# Patient Record
Sex: Female | Born: 1980 | Race: White | Hispanic: No | Marital: Single | Smoking: Former smoker
Health system: Southern US, Community
[De-identification: ages and names within clinical notes are randomized; demographics above are authoritative.]

## PROBLEM LIST (undated history)

## (undated) DIAGNOSIS — Z5189 Encounter for other specified aftercare: Secondary | ICD-10-CM

## (undated) DIAGNOSIS — R87619 Unspecified abnormal cytological findings in specimens from cervix uteri: Secondary | ICD-10-CM

## (undated) DIAGNOSIS — IMO0001 Reserved for inherently not codable concepts without codable children: Secondary | ICD-10-CM

## (undated) DIAGNOSIS — L03119 Cellulitis of unspecified part of limb: Secondary | ICD-10-CM

## (undated) DIAGNOSIS — A64 Unspecified sexually transmitted disease: Secondary | ICD-10-CM

## (undated) DIAGNOSIS — L02619 Cutaneous abscess of unspecified foot: Secondary | ICD-10-CM

## (undated) DIAGNOSIS — B351 Tinea unguium: Secondary | ICD-10-CM

## (undated) HISTORY — DX: Unspecified sexually transmitted disease: A64

## (undated) HISTORY — DX: Cellulitis of unspecified part of limb: L03.119

## (undated) HISTORY — DX: Tinea unguium: B35.1

## (undated) HISTORY — DX: Cutaneous abscess of unspecified foot: L02.619

## (undated) HISTORY — DX: Unspecified abnormal cytological findings in specimens from cervix uteri: R87.619

## (undated) HISTORY — PX: COLPOSCOPY: SHX161

## (undated) HISTORY — DX: Encounter for other specified aftercare: Z51.89

## (undated) HISTORY — DX: Reserved for inherently not codable concepts without codable children: IMO0001

---
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
## 1998-11-20 ENCOUNTER — Other Ambulatory Visit: Admission: RE | Admit: 1998-11-20 | Discharge: 1998-11-20 | Payer: Self-pay | Admitting: *Deleted

## 2000-01-11 ENCOUNTER — Other Ambulatory Visit: Admission: RE | Admit: 2000-01-11 | Discharge: 2000-01-11 | Payer: Self-pay | Admitting: *Deleted

## 2001-04-03 ENCOUNTER — Other Ambulatory Visit: Admission: RE | Admit: 2001-04-03 | Discharge: 2001-04-03 | Payer: Self-pay | Admitting: Obstetrics and Gynecology

## 2002-05-20 ENCOUNTER — Other Ambulatory Visit: Admission: RE | Admit: 2002-05-20 | Discharge: 2002-05-20 | Payer: Self-pay | Admitting: Obstetrics and Gynecology

## 2003-01-30 ENCOUNTER — Emergency Department (HOSPITAL_COMMUNITY): Admission: EM | Admit: 2003-01-30 | Discharge: 2003-01-30 | Payer: Self-pay | Admitting: Emergency Medicine

## 2003-08-08 ENCOUNTER — Other Ambulatory Visit: Admission: RE | Admit: 2003-08-08 | Discharge: 2003-08-08 | Payer: Self-pay | Admitting: Obstetrics and Gynecology

## 2004-09-29 ENCOUNTER — Other Ambulatory Visit: Admission: RE | Admit: 2004-09-29 | Discharge: 2004-09-29 | Payer: Self-pay | Admitting: Obstetrics and Gynecology

## 2005-06-20 ENCOUNTER — Inpatient Hospital Stay (HOSPITAL_COMMUNITY): Admission: AD | Admit: 2005-06-20 | Discharge: 2005-06-22 | Payer: Self-pay | Admitting: Obstetrics and Gynecology

## 2006-03-13 ENCOUNTER — Emergency Department (HOSPITAL_COMMUNITY): Admission: EM | Admit: 2006-03-13 | Discharge: 2006-03-13 | Payer: Self-pay | Admitting: Emergency Medicine

## 2006-09-26 ENCOUNTER — Emergency Department (HOSPITAL_COMMUNITY): Admission: EM | Admit: 2006-09-26 | Discharge: 2006-09-26 | Payer: Self-pay | Admitting: Emergency Medicine

## 2007-02-01 ENCOUNTER — Ambulatory Visit: Payer: Self-pay | Admitting: Internal Medicine

## 2007-02-01 DIAGNOSIS — J0100 Acute maxillary sinusitis, unspecified: Secondary | ICD-10-CM

## 2007-02-01 DIAGNOSIS — F988 Other specified behavioral and emotional disorders with onset usually occurring in childhood and adolescence: Secondary | ICD-10-CM

## 2007-02-01 DIAGNOSIS — B351 Tinea unguium: Secondary | ICD-10-CM | POA: Insufficient documentation

## 2007-02-01 HISTORY — DX: Tinea unguium: B35.1

## 2007-03-02 ENCOUNTER — Ambulatory Visit: Payer: Self-pay | Admitting: Internal Medicine

## 2007-04-23 ENCOUNTER — Telehealth (INDEPENDENT_AMBULATORY_CARE_PROVIDER_SITE_OTHER): Payer: Self-pay | Admitting: *Deleted

## 2007-06-11 ENCOUNTER — Other Ambulatory Visit: Admission: RE | Admit: 2007-06-11 | Discharge: 2007-06-11 | Payer: Self-pay | Admitting: Obstetrics and Gynecology

## 2007-07-11 ENCOUNTER — Ambulatory Visit: Payer: Self-pay | Admitting: Internal Medicine

## 2007-07-11 DIAGNOSIS — T887XXA Unspecified adverse effect of drug or medicament, initial encounter: Secondary | ICD-10-CM | POA: Insufficient documentation

## 2007-07-11 LAB — CONVERTED CEMR LAB
ALT: 32 units/L (ref 0–35)
AST: 30 units/L (ref 0–37)
Albumin: 4 g/dL (ref 3.5–5.2)
Alkaline Phosphatase: 51 units/L (ref 39–117)
Bilirubin, Direct: 0.1 mg/dL (ref 0.0–0.3)
Total Protein: 6.9 g/dL (ref 6.0–8.3)

## 2007-07-12 ENCOUNTER — Telehealth: Payer: Self-pay | Admitting: Internal Medicine

## 2007-09-19 ENCOUNTER — Telehealth: Payer: Self-pay | Admitting: Internal Medicine

## 2007-09-19 ENCOUNTER — Ambulatory Visit: Payer: Self-pay | Admitting: Internal Medicine

## 2007-11-27 ENCOUNTER — Ambulatory Visit: Payer: Self-pay | Admitting: Internal Medicine

## 2008-01-04 HISTORY — PX: FOOT SURGERY: SHX648

## 2008-03-03 ENCOUNTER — Ambulatory Visit: Payer: Self-pay | Admitting: Internal Medicine

## 2008-03-03 DIAGNOSIS — J309 Allergic rhinitis, unspecified: Secondary | ICD-10-CM

## 2008-03-03 DIAGNOSIS — F17200 Nicotine dependence, unspecified, uncomplicated: Secondary | ICD-10-CM | POA: Insufficient documentation

## 2008-06-05 ENCOUNTER — Ambulatory Visit: Payer: Self-pay | Admitting: Internal Medicine

## 2008-09-02 ENCOUNTER — Ambulatory Visit: Payer: Self-pay | Admitting: Internal Medicine

## 2008-09-02 DIAGNOSIS — N390 Urinary tract infection, site not specified: Secondary | ICD-10-CM | POA: Insufficient documentation

## 2008-09-16 ENCOUNTER — Telehealth: Payer: Self-pay | Admitting: Internal Medicine

## 2008-09-16 ENCOUNTER — Ambulatory Visit: Payer: Self-pay | Admitting: Internal Medicine

## 2008-09-16 DIAGNOSIS — L03119 Cellulitis of unspecified part of limb: Secondary | ICD-10-CM

## 2008-09-16 DIAGNOSIS — L02619 Cutaneous abscess of unspecified foot: Secondary | ICD-10-CM | POA: Insufficient documentation

## 2008-09-16 HISTORY — DX: Cutaneous abscess of unspecified foot: L02.619

## 2008-09-17 ENCOUNTER — Encounter: Payer: Self-pay | Admitting: Internal Medicine

## 2008-11-05 ENCOUNTER — Ambulatory Visit: Payer: Self-pay | Admitting: Internal Medicine

## 2008-11-20 ENCOUNTER — Telehealth: Payer: Self-pay | Admitting: Internal Medicine

## 2009-01-14 ENCOUNTER — Telehealth: Payer: Self-pay | Admitting: Internal Medicine

## 2009-01-19 ENCOUNTER — Telehealth: Payer: Self-pay | Admitting: Internal Medicine

## 2009-02-06 ENCOUNTER — Ambulatory Visit: Payer: Self-pay | Admitting: Internal Medicine

## 2009-06-03 DIAGNOSIS — A64 Unspecified sexually transmitted disease: Secondary | ICD-10-CM

## 2009-06-03 HISTORY — DX: Unspecified sexually transmitted disease: A64

## 2009-07-03 ENCOUNTER — Telehealth: Payer: Self-pay | Admitting: Internal Medicine

## 2009-07-28 ENCOUNTER — Telehealth: Payer: Self-pay | Admitting: Internal Medicine

## 2009-08-06 ENCOUNTER — Ambulatory Visit: Payer: Self-pay | Admitting: Internal Medicine

## 2009-09-16 ENCOUNTER — Telehealth: Payer: Self-pay | Admitting: Internal Medicine

## 2009-11-02 ENCOUNTER — Ambulatory Visit: Payer: Self-pay | Admitting: Internal Medicine

## 2009-11-09 ENCOUNTER — Telehealth: Payer: Self-pay | Admitting: Internal Medicine

## 2009-11-10 ENCOUNTER — Ambulatory Visit: Payer: Self-pay | Admitting: Internal Medicine

## 2009-11-13 ENCOUNTER — Encounter: Payer: Self-pay | Admitting: Internal Medicine

## 2009-12-23 ENCOUNTER — Telehealth (INDEPENDENT_AMBULATORY_CARE_PROVIDER_SITE_OTHER): Payer: Self-pay | Admitting: *Deleted

## 2010-01-13 ENCOUNTER — Ambulatory Visit
Admission: RE | Admit: 2010-01-13 | Discharge: 2010-01-13 | Payer: Self-pay | Source: Home / Self Care | Attending: Internal Medicine | Admitting: Internal Medicine

## 2010-01-13 ENCOUNTER — Encounter: Payer: Self-pay | Admitting: Internal Medicine

## 2010-02-04 NOTE — Medication Information (Signed)
Summary: Adderall Xr Approved  Adderall Xr Approved   Imported By: Maryln Gottron 01/21/2010 10:39:38  _____________________________________________________________________  External Attachment:    Type:   Image     Comment:   External Document

## 2010-02-04 NOTE — Progress Notes (Signed)
Summary: pa needed  Phone Note Call from Patient Call back at Home Phone [phone]   Caller: Patient---live call Summary of Call: need pa on adderall to [facility]---ph--[phone]     fax---[phone]       [facility]. Pt stated that she changed ins co. Initial call taken by: Warnell Forester,  December 23, 2009 1:58 PM  Follow-up for Phone Call        Pt called to check on status of getting adderall. Pls call asap. [phone] Follow-up by: Lucy Antigua,  December 24, 2009 11:42 AM  Additional Follow-up for Phone Call Additional follow up Details #1::        IN PROCESS Additional Follow-up by: Warnell Forester,  December 24, 2009 3:47 PM    Additional Follow-up for Phone Call Additional follow up Details #2::    lmoam to return my call Follow-up by: Warnell Forester,  December 30, 2009 3:58 PM

## 2010-02-04 NOTE — Progress Notes (Signed)
Summary: ear clogged  Phone Note Call from Patient Call back at Home Phone [phone]   Caller: vm Summary of Call: Since Thurs, ear clogged, interfering with sound.  Request ov. Initial call taken by: Rudy Jew, RN,  January 19, 2009 12:42 PM  Follow-up for Phone Call        talked with pt- will try allegra with psudeophedrine that she has Follow-up by: Willy Eddy, LPN,  January 19, 2009 1:53 PM

## 2010-02-04 NOTE — Assessment & Plan Note (Signed)
Summary: 2 MONTH ROV/cjr   Vital Signs:  Patient profile:   30 year old female Height:      63.5 inches Weight:      112 pounds BMI:     19.60 Temp:     98.2 degrees F oral Pulse rate:   76 / minute Resp:     14 per minute BP sitting:   110 / 70  (left arm)  Vitals Entered By: Willy Eddy, LPN (November 02, 2009 8:46 AM) CC: roa- med review Is Patient Diabetic? No   Primary Care Provider:  Stacie Glaze MD  CC:  roa- med review.  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 One By Mouth On Work Days 2)  Adderall 5 Mg  Tabs (Amphetamine-Dextroamphetamine) .... One By Mouth After Lunch 3)  Lohist-12d 6-45 Mg Xr12h-Tab (Brompheniramine-Pseudoeph) .... One By Mouth Bid 4)  Vyvanse 50 Mg Caps (Lisdexamfetamine Dimesylate) .... One By Mouth in Am  Allergies (verified): 1)  ! Bactrim (Sulfamethoxazole-Trimethoprim)  Past History:  Family History: Last updated: 02/01/2007 Family History High cholesterol Family History Hypertension Family History of Prostate CA 1st  degree relative <50  Social History: Last updated: 02/01/2007 Married Current Smoker Drug use-no Regular exercise-yes  Risk Factors: Exercise: yes (02/01/2007)  Risk Factors: Smoking Status: current (11/02/2009) Packs/Day: 0.5 (11/02/2009) Passive Smoke Exposure: no (11/02/2009)  Past medical, surgical, family and social histories (including risk factors) reviewed, and no changes noted (except as noted below).  Past Medical History: Reviewed history from 02/01/2007 and no changes required. blood tranfusion  due to placental abruption  Family History: Reviewed history from 02/01/2007 and no changes required. Family History High cholesterol Family History Hypertension Family History of Prostate CA 1st degree relative <50  Social History: Reviewed history from 02/01/2007 and no changes required. Married Current Smoker Drug use-no Regular exercise-yes  Review of Systems  The patient denies anorexia, fever, weight loss, weight gain, vision loss, decreased hearing, hoarseness, chest pain, syncope, dyspnea on exertion, peripheral edema, prolonged cough, headaches, hemoptysis, abdominal pain, melena, hematochezia, severe indigestion/heartburn, hematuria, incontinence, genital sores, muscle weakness, suspicious skin lesions, transient blindness, difficulty walking, depression, unusual weight change, abnormal bleeding, enlarged lymph nodes, angioedema, and breast masses.         Flu Vaccine Consent Questions     Do you have a history of severe allergic reactions to this vaccine? no    Any prior history of allergic reactions to egg and/or gelatin? no    Do you have a sensitivity to the preservative Thimersol?  no    Do you have a past history of Guillan-Barre Syndrome? no    Do you currently have an acute febrile illness? no    Have you ever had a severe reaction to latex? no    Vaccine information given and explained to patient? yes    Are you currently pregnant? no    Lot  Number:[id number]   Exp Date:07/03/2010   Site Given  Left Deltoid IM   Physical Exam  General:  Well-developed,well-nourished,in no acute distress; alert,appropriate and cooperative throughout examination Head:  Normocephalic and atraumatic without obvious abnormalities. No apparent alopecia or balding. Eyes:  pupils equal and pupils round.   Ears:  R ear normal and L ear normal.   Nose:  no external deformity and no nasal discharge.   Mouth:  pharyngeal erythema and postnasal drip.   Neck:  supple and full ROM.   Lungs:  Normal respiratory effort, chest expands symmetrically. Lungs are clear to auscultation, no crackles or wheezes. Heart:  Normal rate and regular rhythm. S1 and S2 normal without gallop, murmur, click, rub or other extra sounds. Abdomen:  Bowel sounds positive,abdomen soft and non-tender without masses, organomegaly or hernias noted. Extremities:  No clubbing, cyanosis, edema, or deformity noted with normal full range of motion of all joints.   Neurologic:  No cranial nerve deficits noted. Station and gait are normal. Plantar reflexes are down-going bilaterally. DTRs are symmetrical throughout. Sensory, motor and coordinative functions appear intact.   Impression & Recommendations:  Problem # 1:  ATTENTION DEFICIT DISORDER, ADULT (ICD-314.00)  Complete Medication List: 1)  Lohist-12d 6-45 Mg Xr12h-tab (Brompheniramine-pseudoeph) .... One by mouth bid 2)  Vyvanse 50 Mg Caps (Lisdexamfetamine dimesylate) .... One by mouth in am with food  Other Orders: Admin 1st Vaccine (16109) Flu Vaccine 31yrs + (60454)  Patient Instructions: 1)  Please schedule a follow-up appointment in 3 months. Prescriptions: VYVANSE 50 MG CAPS (LISDEXAMFETAMINE DIMESYLATE) one by mouth in AM with food  #30 x 0   Entered and Authorized by:   Stacie Glaze MD   Signed by:   Stacie Glaze MD on 11/02/2009   Method used:   Print then Give to Patient   RxID:   [id number] VYVANSE 50 MG  CAPS (LISDEXAMFETAMINE DIMESYLATE) one by mouth in AM with food  #30 x 0   Entered and Authorized by:   Stacie Glaze MD   Signed by:   Stacie Glaze MD on 11/02/2009   Method used:   Print then Give to Patient   RxID:   [id number] VYVANSE 50 MG CAPS (LISDEXAMFETAMINE DIMESYLATE) one by mouth in AM with food  #30 x 0   Entered and Authorized by:   Stacie Glaze MD   Signed by:   Stacie Glaze MD on 11/02/2009   Method used:   Print then Give to Patient   RxID:   [id number]    Orders Added: 1)  Admin 1st Vaccine [90471] 2)  Flu Vaccine 60yrs + [44034] 3)  Est. Patient Level III [74259]

## 2010-02-04 NOTE — Medication Information (Signed)
Summary: Adderall Xr Approved  Adderall Xr Approved   Imported By: Maryln Gottron 11/18/2009 14:18:11  _____________________________________________________________________  External Attachment:    Type:   Image     Comment:   External Document

## 2010-02-04 NOTE — Progress Notes (Signed)
Summary: REQ FOR MED / RX  Phone Note Call from Patient   Caller: Patient   [phone] Complaint: Urinary/GYN Problems Summary of Call: Pt called to request that a Rx for a stronger mucinex-type medication or abx be sent into [facility])   [address] [place], [place] [address]   [phone]...Marland KitchenMarland KitchenMarland Kitchen Pt adv that she will come in for appt when she gets back in town.... Pt c/o productive cough (yellow-green sputum), congestion, LG fever at times, chest / lung tightness.... Pt adv that smoke in area is very bad and is irritating her sxs.... Pt can be reached at [phone] with any questions or concerns.  Initial call taken by: Debbra Riding,  July 03, 2009 2:56 PM  Follow-up for Phone Call        per dr Lovell Sheehan- may have a tuss12  ours- 2 tsp every 12 hours 8 oz  Follow-up by: Willy Eddy, LPN,  July 04, 9627 4:27 PM    New/Updated Medications: ATUSS DS 30-4-30 MG/5ML SUSP (PSEUDOEPHED HCL-CPM-DM HBR TAN) 2 teaspoons q 8 hours as needed cough Prescriptions: ATUSS DS 30-4-30 MG/5ML SUSP (PSEUDOEPHED HCL-CPM-DM HBR TAN) 2 teaspoons q 8 hours as needed cough  #8 x 0   Entered by:   Lynann Beaver CMA   Authorized by:   Stacie Glaze MD   Signed by:   Lynann Beaver CMA on 07/03/2009   Method used:   Telephoned to ...       [facility] [address] [phone]* (retail)       [address]       [place], [place]  [address]       Ph: [phone] or [phone]       Fax: [phone]   RxID:   [id number]  Sent to [facility] in above note and pt notified.

## 2010-02-04 NOTE — Assessment & Plan Note (Signed)
Summary: 3 month fup//ccm   Vital Signs:  Patient profile:   30 year old female Height:      63.5 inches Weight:      106 pounds Temp:     98.2 degrees F oral Pulse rate:   72 / minute Resp:     14 per minute BP sitting:   110 / 70  Vitals Entered By: Willy Eddy, LPN (January 13, 2010 3:28 PM)  Primary Care Provider:  Stacie Glaze MD   History of Present Illness: had an alleric reaction  to vvyance ( two trials) still has ADD with distractability that effects job performance smoker  Preventive Screening-Counseling & Management  Alcohol-Tobacco     Smoking Status: current     Tobacco Counseling: to quit use of tobacco products  Problems Prior to Update: 1)  Cellulitis, Foot, Left  (ICD-682.7) 2)  Uti's, 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 One By Mouth Bid 2)  Adderall Xr 20 Mg Xr24h-Cap (Amphetamine-Dextroamphetamine) .... One By Mouth in Am and A Second At 2 Pm  Current Medications (verified): 1)  Lohist-12d 6-45 Mg Xr12h-Tab (Brompheniramine-Pseudoeph) .... One By Mouth Bid 2)  Adderall 20 Mg Tabs (Amphetamine-Dextroamphetamine) .... One By Mouth Two Times A Day  Allergies (verified): 1)  ! Bactrim (Sulfamethoxazole-Trimethoprim)  Past History:  Family History: Last updated: 02/01/2007 Family History High cholesterol Family History Hypertension Family History of Prostate CA 1st degree relative <50  Social History: Last updated: 02/01/2007 Married Current Smoker Drug use-no Regular exercise-yes  Risk Factors: Exercise: yes (02/01/2007)  Risk Factors: Smoking Status: current  (01/13/2010) Packs/Day: 0.5 (11/10/2009) Passive Smoke Exposure: no (11/10/2009)  Past medical, surgical, family and social histories (including risk factors) reviewed, and no changes noted (except as noted below).  Past Medical History: Reviewed history from 02/01/2007 and no changes required. blood tranfusion  due to placental abruption  Family History: Reviewed history from 02/01/2007 and no changes required. Family History High cholesterol Family History Hypertension Family History of Prostate CA 1st degree relative <50  Social History: Reviewed history from 02/01/2007 and no changes required. Married Current Smoker Drug use-no Regular exercise-yes  Review of Systems  The patient denies anorexia, fever, weight loss, weight gain, vision loss, decreased hearing, hoarseness, chest pain, syncope, dyspnea on exertion, peripheral edema, prolonged cough, headaches, hemoptysis, abdominal pain, melena, hematochezia, severe indigestion/heartburn, hematuria, incontinence, genital sores, muscle weakness, suspicious skin lesions, transient blindness, difficulty walking, depression, unusual weight change, abnormal bleeding, enlarged lymph nodes, angioedema, and breast masses.    Physical Exam  General:  Well-developed,well-nourished,in no acute distress; alert,appropriate and cooperative throughout examination Eyes:  pupils equal and pupils round.   Ears:  R ear normal and L ear normal.   Nose:  no external deformity and no nasal discharge.   Neck:  supple and full ROM.   Lungs:  Normal respiratory effort, chest expands symmetrically. Lungs are clear to auscultation, no crackles or wheezes. Heart:  Normal rate and regular rhythm. S1 and S2 normal without gallop, murmur, click, rub or other extra sounds. Abdomen:  Bowel sounds positive,abdomen soft and non-tender without masses, organomegaly or hernias noted.   Impression & Recommendations:  Problem #  1:  CIGARETTE SMOKER  (ICD-305.1) Assessment Deteriorated  Encouraged smoking cessation and discussed different methods for smoking cessation.   Problem # 2:  UNS ADVRS EFF UNS RX MEDICINAL&BIOLOGICAL SBSTNC (ICD-995.20) Assessment: Deteriorated allergic reaction to vvyance  Problem # 3:  ATTENTION DEFICIT DISORDER, ADULT (ICD-314.00)  Complete Medication List: 1)  Lohist-12d 6-45 Mg Xr12h-tab (Brompheniramine-pseudoeph) .... One by mouth bid 2)  Adderall 20 Mg Tabs (Amphetamine-dextroamphetamine) .... One by mouth two times a day  Patient Instructions: 1)  Please schedule a follow-up appointment in 3 months. Prescriptions: ADDERALL 20 MG TABS (AMPHETAMINE-DEXTROAMPHETAMINE) one by mouth two times a day  #60 x 0   Entered and Authorized by:   Stacie Glaze MD   Signed by:   Stacie Glaze MD on 01/13/2010   Method used:   Print then Give to Patient   RxID:   [id number] ADDERALL 20 MG TABS (AMPHETAMINE-DEXTROAMPHETAMINE) one by mouth two times a day  #60 x 0   Entered and Authorized by:   Stacie Glaze MD   Signed by:   Stacie Glaze MD on 01/13/2010   Method used:   Print then Give to Patient   RxID:   [id number] ADDERALL 20 MG TABS (AMPHETAMINE-DEXTROAMPHETAMINE) one by mouth two times a day  #60 x 0   Entered and Authorized by:   Stacie Glaze MD   Signed by:   Stacie Glaze MD on 01/13/2010   Method used:   Print then Give to Patient   RxID:   [id number]    Orders Added: 1)  Est. Patient Level III [53664]

## 2010-02-04 NOTE — Assessment & Plan Note (Signed)
Summary: 3 mo rov/mm   Vital Signs:  Patient profile:   30 year old female Height:      63.5 inches Weight:      110 pounds BMI:     19.25 Temp:     98.2 degrees F oral Pulse rate:   72 / minute Resp:     12 per minute BP sitting:   110 / 70  (left arm)  Vitals Entered By: Willy Eddy, LPN (February 06, 2009 8:12 AM) CC: 3 month roa   Primary Care Provider:  Stacie Glaze MD  CC:  3 month roa.  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 One By Mouth On Work Days 2)  Adderall 5 Mg  Tabs (Amphetamine-Dextroamphetamine) .... One By Mouth After Lunch 3)  Fexofenadine-Pseudoephedrine 60-120 Mg Xr12h-Tab (Fexofenadine-Pseudoephedrine) .... One By Mouth Two Times A Day Prn 4)  Chantix Starting Month Pak 0.5 Mg X 11 & 1 Mg X 42 Tabs (Varenicline Tartrate) .... Take As Directed 5)  Chantix 1 Mg Tabs (Varenicline Tartrate) .... One By Mouth Bid  Current Medications (verified): 1)  Adderall Xr 20 Mg  Cp24 (Amphetamine-Dextroamphetamine) .... One By Mouth On Work Days 2)  Adderall 5 Mg  Tabs (Amphetamine-Dextroamphetamine) .... One By Mouth After Lunch 3)   Fexofenadine-Pseudoephedrine 60-120 Mg Xr12h-Tab (Fexofenadine-Pseudoephedrine) .... One By Mouth Two Times A Day Prn 4)  Chantix Starting Month Pak 0.5 Mg X 11 & 1 Mg X 42 Tabs (Varenicline Tartrate) .... Take As Directed 5)  Chantix 1 Mg Tabs (Varenicline Tartrate) .... One By Mouth Bid  Allergies (verified): 1)  ! Bactrim (Sulfamethoxazole-Trimethoprim)  Past History:  Family History: Last updated: 02/01/2007 Family History High cholesterol Family History Hypertension Family History of Prostate CA 1st degree relative <50  Social History: Last updated: 02/01/2007 Married Current Smoker Drug use-no Regular exercise-yes  Risk Factors: Exercise: yes (02/01/2007)  Risk Factors: Smoking Status: current (02/06/2009) Packs/Day: 0.5 (02/06/2009) Passive Smoke Exposure: no (02/06/2009)  Past medical, surgical, family and social histories (including risk factors) reviewed, and no changes noted (except as noted below).  Past Medical History: Reviewed history from 02/01/2007 and no changes required. blood tranfusion  due to placental abruption  Family History: Reviewed history from 02/01/2007 and no changes required. Family History High cholesterol Family History Hypertension Family History of Prostate CA 1st degree relative <50  Social History: Reviewed history from 02/01/2007 and no changes required. Married Current Smoker Drug use-no Regular exercise-yes  Physical Exam  General:  Well-developed,well-nourished,in no acute distress; alert,appropriate and cooperative throughout examination Head:  Normocephalic and atraumatic without obvious abnormalities. No apparent alopecia or balding. Eyes:  pupils equal and  pupils round.   Ears:  R ear normal and L ear normal.   Nose:  no external deformity and no nasal discharge.   Mouth:  pharyngeal erythema and postnasal drip.   Neck:  supple and full ROM.   Lungs:  Normal respiratory effort, chest expands symmetrically. Lungs  are clear to auscultation, no crackles or wheezes. Heart:  Normal rate and regular rhythm. S1 and S2 normal without gallop, murmur, click, rub or other extra sounds. Abdomen:  Bowel sounds positive,abdomen soft and non-tender without masses, organomegaly or hernias noted.   Impression & Recommendations:  Problem # 1:  CIGARETTE SMOKER (ICD-305.1) 10 min of counsilling to quit Her updated medication list for this problem includes:    Chantix Starting Month Pak 0.5 Mg X 11 & 1 Mg X 42 Tabs (Varenicline tartrate) .Marland Kitchen... Take as directed    Chantix 1 Mg Tabs (Varenicline tartrate) ..... One by mouth bid  Encouraged smoking cessation and discussed different methods for smoking cessation.   Orders: Tobacco use cessation intermediate 3-10 minutes (99406)  Problem # 2:  ATTENTION DEFICIT DISORDER, ADULT (ICD-314.00) refill medications and discuss weight and side efect  Problem # 3:  ALLERGIC RHINITIS CAUSE UNSPECIFIED (ICD-477.9) stable  Problem # 4:  UNS ADVRS EFF UNS RX MEDICINAL&BIOLOGICAL SBSTNC (ICD-995.20) monitering ADHD drugs and weight  Complete Medication List: 1)  Adderall Xr 20 Mg Cp24 (Amphetamine-dextroamphetamine) .... One by mouth on work days 2)  Adderall 5 Mg Tabs (Amphetamine-dextroamphetamine) .... One by mouth after lunch 3)  Fexofenadine-pseudoephedrine 60-120 Mg Xr12h-tab (Fexofenadine-pseudoephedrine) .... One by mouth two times a day prn 4)  Chantix Starting Month Pak 0.5 Mg X 11 & 1 Mg X 42 Tabs (Varenicline tartrate) .... Take as directed 5)  Chantix 1 Mg Tabs (Varenicline tartrate) .... One by mouth bid  Patient Instructions: 1)  Please schedule a follow-up appointment in 3 months. Prescriptions: ADDERALL 5 MG  TABS (AMPHETAMINE-DEXTROAMPHETAMINE) one by mouth after lunch  #30 x 0   Entered by:   Willy Eddy, LPN   Authorized by:   Stacie Glaze MD   Signed by:   Willy Eddy, LPN on 16/10/9602   Method used:   Print then Give to Patient    RxID:   [id number] ADDERALL XR 20 MG  CP24 (AMPHETAMINE-DEXTROAMPHETAMINE) one by mouth on work days  #30 x 0   Entered by:   Willy Eddy, LPN   Authorized by:   Stacie Glaze MD   Signed by:   Willy Eddy, LPN on 21/30/8657   Method used:   Print then Give to Patient   RxID:   [id number] ADDERALL 5 MG  TABS (AMPHETAMINE-DEXTROAMPHETAMINE) one by mouth after lunch  #30 x 0   Entered by:   Willy Eddy, LPN   Authorized by:   Stacie Glaze MD   Signed by:   Willy Eddy, LPN on 01/05/7251   Method used:   Print then Give to Patient   RxID:   [id number] ADDERALL XR 20 MG  CP24 (AMPHETAMINE-DEXTROAMPHETAMINE) one by mouth on work days  #30 x 0   Entered by:   Willy Eddy, LPN   Authorized by:   Stacie Glaze MD   Signed by:   Willy Eddy, LPN on 75/64/3329   Method used:   Print then Give to Patient   RxID:   [id number] ADDERALL XR 20 MG  CP24 (AMPHETAMINE-DEXTROAMPHETAMINE) one by mouth on work days  #30  x 0   Entered by:   Willy Eddy, LPN   Authorized by:   Stacie Glaze MD   Signed by:   Willy Eddy, LPN on 16/10/9602   Method used:   Print then Give to Patient   RxID:   [id number] ADDERALL 5 MG  TABS (AMPHETAMINE-DEXTROAMPHETAMINE) one by mouth after lunch  #30 x 0   Entered by:   Willy Eddy, LPN   Authorized by:   Stacie Glaze MD   Signed by:   Willy Eddy, LPN on 21/30/8657   Method used:   Print then Give to Patient   RxID:   [id number]

## 2010-02-04 NOTE — Progress Notes (Signed)
Summary: PHONE CALL  Phone Note Call from Patient   Caller: Patient [phone] Reason for Call: Talk to Doctor Summary of Call: Pt called in to see if Dr Lovell Sheehan will accept her boyfriend Shelly Bombard, DOB: 07.21.1982.Marland KitchenMarland KitchenMRN- [id number]) as a new patient.... Pt adv that her mother Davy Pique)  was in for an appt with her grandfather Michaela Corner) and said that Dr Lovell Sheehan stated that he would consider taking on the boyfriend as a new patient..... Can  you advise so an appt can be scheduled??  Pt can be reached at [phone].   Initial call taken by: Debbra Riding,  January 14, 2009 3:30 PM  Follow-up for Phone Call        per dr Lovell Sheehan- will take pt Follow-up by: Willy Eddy, LPN,  January 15, 2009 10:45 AM  Additional Follow-up for Phone Call Additional follow up Details #1::        Phone Call [facility], appt was set up for a new pt est with Dr Lovell Sheehan on March 03, 2009 @ 2:45pm.  Additional Follow-up by: Debbra Riding,  January 15, 2009 11:10 AM

## 2010-02-04 NOTE — Progress Notes (Signed)
  Phone Note Call from Patient   Summary of Call: having allergic reaction to vyvance- rash with itching- stopped on sunday-please advise Initial call taken by: Willy Eddy, LPN,  November 09, 2009 3:07 PM  Follow-up for Phone Call        Called back to tell Dr. Lovell Sheehan that she is allergic to [facility], and wants to know what to do. Follow-up by: Lynann Beaver CMA AAMA,  November 10, 2009 9:32 AM  Additional Follow-up for Phone Call Additional follow up Details #1::        Pt coming in for appt today.  Additional Follow-up by: Lucy Antigua,  November 10, 2009 10:45 AM

## 2010-02-04 NOTE — Progress Notes (Signed)
Summary: allergic reaction? LMTCB 9-14  Phone Note Call from Patient Call back at Home Phone [phone]   Caller: Patient Call For: Stacie Glaze MD Summary of Call: Dr Terri Piedra saw pt for facial rash, and thinks it may be an allergic reaction to Vyvance.  Pt would like to change back to Adderal.   Initial call taken by: Lynann Beaver CMA,  September 16, 2009 10:07 AM  Follow-up for Phone Call        may try but the compounds are very similar resume addreal at prior does Follow-up by: Stacie Glaze MD,  September 16, 2009 3:50 PM  Additional Follow-up for Phone Call Additional follow up Details #1::        LMTCB Rudy Jew, RN  September 16, 2009 4:32 PM     Additional Follow-up for Phone Call Additional follow up Details #2::    LMTCB if she wants written prescriptions.  Pt wants written prescriptions for Adderal and can pick up tomorrow. Follow-up by: Lynann Beaver CMA,  September 17, 2009 10:10 AM  Prescriptions: ADDERALL 5 MG  TABS (AMPHETAMINE-DEXTROAMPHETAMINE) one by mouth after lunch  #30 x 0   Entered by:   Willy Eddy, LPN   Authorized by:   Stacie Glaze MD   Signed by:   Willy Eddy, LPN on 47/82/9562   Method used:   Print then Give to Patient   RxID:   [id number]

## 2010-02-04 NOTE — Assessment & Plan Note (Signed)
Summary: med check/cjr   Vital Signs:  Patient profile:   30 year old female Height:      63.5 inches Weight:      112 pounds BMI:     19.60 Temp:     98.2 degrees F oral Pulse rate:   72 / minute Resp:     12 per minute BP sitting:   110 / 70  (left arm)  Vitals Entered By: Willy Eddy, LPN (November 10, 2009 4:08 PM) CC: allergic reaction to vyvance Is Patient Diabetic? No   Primary Care Provider:  Stacie Glaze MD  CC:  allergic reaction to vyvance.  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) 3)  Cigarette Smoker  (ICD-305.1) 4)  Allergic Rhinitis Cause Unspecified  (ICD-477.9) 5)  Uns Advrs Eff Uns Rx Medicinal&biological Sbstnc  (ICD-995.20) 6)  Attention Deficit Disorder, Adult  (ICD-314.00) 7)  Dermatophytosis of Nail  (ICD-110.1) 8)  Acute Maxillary Sinusitis  (ICD-461.0)  Current Problems (verified): 1)  Cellulitis, Foot, Left  (ICD-682.7) 2)  Uti's, 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 One By Mouth Bid 2)  Vyvanse 50 Mg Caps (Lisdexamfetamine Dimesylate) .... One By Mouth in Am With Food  Current Medications  (verified): 1)  Lohist-12d 6-45 Mg Xr12h-Tab (Brompheniramine-Pseudoeph) .... One By Mouth Bid 2)  Adderall Xr 20 Mg Xr24h-Cap (Amphetamine-Dextroamphetamine) .... One By Mouth in Am and A Second At 2 Pm  Allergies (verified): 1)  ! Bactrim (Sulfamethoxazole-Trimethoprim)  Past History:  Family History: Last updated: 02/01/2007 Family History High cholesterol Family History Hypertension Family History of Prostate CA 1st degree relative <50  Social History: Last updated: 02/01/2007 Married Current Smoker Drug use-no Regular exercise-yes  Risk Factors: Exercise: yes (02/01/2007)  Risk Factors: Smoking Status: current (11/10/2009) Packs/Day: 0.5 (11/10/2009) Passive Smoke Exposure: no (11/10/2009)  Past medical, surgical, family and social histories (including risk factors) reviewed, and no changes noted (except as noted below).  Past Medical History: Reviewed history from 02/01/2007 and no changes required. blood tranfusion  due to placental abruption  Family History: Reviewed history from 02/01/2007 and no changes required. Family History High cholesterol Family History Hypertension Family History of Prostate CA 1st degree relative <50  Social History: Reviewed history from 02/01/2007 and no changes required. Married Current Smoker Drug use-no Regular exercise-yes  Review of Systems  The patient denies anorexia, fever, weight loss, weight gain, vision loss, decreased hearing, hoarseness, chest pain, syncope, dyspnea on exertion, peripheral edema, prolonged cough, headaches, hemoptysis, abdominal pain, melena, hematochezia, severe indigestion/heartburn, hematuria, incontinence, genital sores, muscle weakness, suspicious skin lesions, transient blindness, difficulty walking, depression, unusual weight change, abnormal  bleeding, enlarged lymph nodes, angioedema, and breast masses.    Physical Exam  General:  Well-developed,well-nourished,in no acute distress;  alert,appropriate and cooperative throughout examination Head:  Normocephalic and atraumatic without obvious abnormalities. No apparent alopecia or balding. Eyes:  pupils equal and pupils round.   Neck:  supple and full ROM.   Lungs:  Normal respiratory effort, chest expands symmetrically. Lungs are clear to auscultation, no crackles or wheezes. Heart:  Normal rate and regular rhythm. S1 and S2 normal without gallop, murmur, click, rub or other extra sounds.   Impression & Recommendations:  Problem # 1:  CIGARETTE SMOKER (ICD-305.1) needs to quit 5 min  Problem # 2:  ATTENTION DEFICIT DISORDER, ADULT (ICD-314.00) medicine change and monitering  Complete Medication List: 1)  Lohist-12d 6-45 Mg Xr12h-tab (Brompheniramine-pseudoeph) .... One by mouth bid 2)  Adderall Xr 20 Mg Xr24h-cap (Amphetamine-dextroamphetamine) .... One by mouth in am and a second at 2 pm  Patient Instructions: 1)  Please schedule a follow-up appointment in 2 months. Prescriptions: ADDERALL XR 20 MG XR24H-CAP (AMPHETAMINE-DEXTROAMPHETAMINE) one by mouth in AM and a second at 2 pm  #60 x 0   Entered and Authorized by:   Stacie Glaze MD   Signed by:   Stacie Glaze MD on 11/10/2009   Method used:   Print then Give to Patient   RxID:   [id number] ADDERALL XR 20 MG XR24H-CAP (AMPHETAMINE-DEXTROAMPHETAMINE) one by mouth in AM and a second at 2 pm  #60 x 0   Entered and Authorized by:   Stacie Glaze MD   Signed by:   Stacie Glaze MD on 11/10/2009   Method used:   Print then Give to Patient   RxID:   [id number]    Orders Added: 1)  Est. Patient Level III [56433]

## 2010-02-04 NOTE — Assessment & Plan Note (Signed)
Summary: 3 month rov/njr [facility] BMP/NJR   Vital Signs:  Patient profile:   30 year old female Height:      63.5 inches Weight:      110 pounds BMI:     19.25 Temp:     98.2 degrees F oral Pulse rate:   93 / minute BP sitting:   110 / 72  (left arm) Cuff size:   regular  Vitals Entered By: Kathrynn Speed CMA (August 06, 2009 10:40 AM) CC: 3 mthsfor Adderall, src Is Patient Diabetic? No   Primary Care Provider:  Stacie Glaze MD  CC:  3 mthsfor Adderall and src.  History of Present Illness: increased congestion with working outside [employer] been using alledra D and her symptoms improved rapidly with the flair she had intense congestion she still smokes and has a hx of asthma she presents for [employer] of ADD  Preventive Screening-Counseling & Management  Alcohol-Tobacco     Smoking Status: current     Smoking Cessation Counseling: yes     Packs/Day: 0.5     Year Started: 1994     Passive Smoke Exposure: no  Problems Prior to Update: 1)  Cellulitis, Foot, Left  (ICD-682.7) 2)  Uti's, Recurrent  (ICD-599.0) 3)  Cigarette Smoker  (ICD-305.1) 4)  Allergic Rhinitis Cause Unspecified  (ICD-477.9) 5)  Uns Advrs Eff Uns Rx Medicinal&biological Sbstnc  (ICD-995.20) 6)  Attention Deficit Disorder, Adult  (ICD-314.00) 7)  Dermatophytosis of Nail  (ICD-110.1) 8)  Acute Maxillary Sinusitis  (ICD-461.0)  Medications Prior to Update: 1)  Adderall Xr 20 Mg  Cp24 (Amphetamine-Dextroamphetamine) .... One By Mouth On Work Days 2)  Adderall 5 Mg  Tabs (Amphetamine-Dextroamphetamine) .... One By Mouth After Lunch 3)  Fexofenadine-Pseudoephedrine 60-120 Mg Xr12h-Tab (Fexofenadine-Pseudoephedrine) .... One By Mouth Two Times A Day Prn 4)  Chantix Starting Month Pak 0.5 Mg X 11 & 1 Mg X 42 Tabs (Varenicline Tartrate) .... Take As Directed 5)  Chantix 1 Mg Tabs (Varenicline Tartrate) .... One By Mouth Bid 6)  Atuss Ds 30-4-30 Mg/52ml Susp (Pseudoephed Hcl-Cpm-Dm Hbr Tan) .... 2 Teaspoons Q 8  Hours As Needed Cough  Current Medications (verified): 1)  Adderall Xr 20 Mg  Cp24 (Amphetamine-Dextroamphetamine) .... One By Mouth On Work Days 2)  Adderall 5 Mg  Tabs (Amphetamine-Dextroamphetamine) .... One By Mouth After Lunch 3)  Lohist-12d 6-45 Mg Xr12h-Tab (Brompheniramine-Pseudoeph) .... One By Mouth Bid 4)  Vyvanse 50 Mg Caps (Lisdexamfetamine Dimesylate) .... One By Mouth in Am  Allergies (verified): 1)  ! Bactrim (Sulfamethoxazole-Trimethoprim)  Past History:  Family History: Last updated: 02/01/2007 Family History High cholesterol Family History Hypertension Family History of Prostate CA 1st degree relative <50  Social History: Last updated: 02/01/2007 Married Current Smoker Drug use-no Regular exercise-yes  Risk Factors: Exercise: yes (02/01/2007)  Risk Factors: Smoking Status: current (08/06/2009) Packs/Day: 0.5 (08/06/2009) Passive Smoke Exposure: no (08/06/2009)  Past medical, surgical, family and social histories (including risk factors) reviewed, and no changes noted (except as noted below).  Past Medical History: Reviewed history from 02/01/2007 and no changes required. blood tranfusion  due to placental abruption  Family History: Reviewed history from 02/01/2007 and no changes required. Family History High cholesterol Family History Hypertension Family History of Prostate CA 1st degree relative <50  Social History: Reviewed history from 02/01/2007 and no changes required. Married Current Smoker Drug use-no Regular exercise-yes  Review of Systems       The patient complains of prolonged cough.  The patient denies  anorexia, fever, weight loss, weight gain, vision loss, decreased hearing, hoarseness, chest pain, syncope, dyspnea on exertion, peripheral edema, headaches, hemoptysis, abdominal pain, melena, hematochezia, severe indigestion/heartburn, hematuria, incontinence, genital sores, muscle weakness, suspicious skin lesions, transient  blindness, difficulty walking, depression, unusual weight change, abnormal bleeding, enlarged lymph nodes, angioedema, and breast masses.    Physical Exam  General:  Well-developed,well-nourished,in no acute distress; alert,appropriate and cooperative throughout examination Head:  Normocephalic and atraumatic without obvious abnormalities. No apparent alopecia or balding. Eyes:  pupils equal and pupils round.   Ears:  R ear normal and L ear normal.   Neck:  supple and full ROM.   Lungs:  Normal respiratory effort, chest expands symmetrically. Lungs are clear to auscultation, no crackles or wheezes. Heart:  Normal rate and regular rhythm. S1 and S2 normal without gallop, murmur, click, rub or other extra sounds. Abdomen:  Bowel sounds positive,abdomen soft and non-tender without masses, organomegaly or hernias noted.   Impression & Recommendations:  Problem # 1:  CIGARETTE SMOKER (ICD-305.1)  The following medications were removed from the medication list:    Chantix Starting Month Pak 0.5 Mg X 11 & 1 Mg X 42 Tabs (Varenicline tartrate) .Marland Kitchen... Take as directed    Chantix 1 Mg Tabs (Varenicline tartrate) ..... One by mouth bid  Encouraged smoking cessation and discussed different methods for smoking cessation.   Problem # 2:  ATTENTION DEFICIT DISORDER, ADULT (ICD-314.00) change ot vyance  Problem # 3:  ALLERGIC RHINITIS CAUSE UNSPECIFIED (ICD-477.9) change of meidcations Discussed use of allergy medications and environmental measures.   Complete Medication List: 1)  Adderall Xr 20 Mg Cp24 (Amphetamine-dextroamphetamine) .... One by mouth on work days 2)  Adderall 5 Mg Tabs (Amphetamine-dextroamphetamine) .... One by mouth after lunch 3)  Lohist-12d 6-45 Mg Xr12h-tab (Brompheniramine-pseudoeph) .... One by mouth bid 4)  Vyvanse 50 Mg Caps (Lisdexamfetamine dimesylate) .... One by mouth in am  Patient Instructions: 1)  Please schedule a follow-up appointment in 2  months. Prescriptions: LOHIST-12D 6-45 MG XR12H-TAB (BROMPHENIRAMINE-PSEUDOEPH) one by mouth BID  #60 x 3   Entered and Authorized by:   Stacie Glaze MD   Signed by:   Stacie Glaze MD on 08/06/2009   Method used:   Print then Give to Patient   RxID:   [id number] VYVANSE 50 MG CAPS (LISDEXAMFETAMINE DIMESYLATE) one by mouth in AM  #30 x 0   Entered and Authorized by:   Stacie Glaze MD   Signed by:   Stacie Glaze MD on 08/06/2009   Method used:   Print then Give to Patient   RxID:   [id number] VYVANSE 50 MG CAPS (LISDEXAMFETAMINE DIMESYLATE) one by mouth in AM  #30 x 0   Entered and Authorized by:   Stacie Glaze MD   Signed by:   Stacie Glaze MD on 08/06/2009   Method used:   Print then Give to Patient   RxID:   [id number]

## 2010-02-04 NOTE — Progress Notes (Signed)
Summary: Possible Sinus Infection  Phone Note Call from Patient Call back at Home Phone [phone]   Caller: Patient Call For: Stacie Glaze MD Reason for Call: Acute Illness Summary of Call: Pt.  may have possible sinus infection?? Wants a return call.  Follow-up for Phone Call        left message on machine call back Follow-up by: Willy Eddy, LPN,  July 28, 2009 12:18 PM  Additional Follow-up for Phone Call Additional follow up Details #1::        left message on machine told pt to use saline nasal spray otc and may use otc decongestant- call if needs [place] or if not better Additional Follow-up by: Willy Eddy, LPN,  July 28, 2009 2:08 PM

## 2010-04-02 ENCOUNTER — Encounter: Payer: Self-pay | Admitting: Internal Medicine

## 2010-04-02 ENCOUNTER — Emergency Department (HOSPITAL_COMMUNITY): Payer: PRIVATE HEALTH INSURANCE

## 2010-04-02 ENCOUNTER — Emergency Department (HOSPITAL_COMMUNITY)
Admission: EM | Admit: 2010-04-02 | Discharge: 2010-04-02 | Disposition: A | Discharge status: Home / Self Care | Payer: PRIVATE HEALTH INSURANCE | Attending: Emergency Medicine | Admitting: Emergency Medicine

## 2010-04-02 DIAGNOSIS — S51809A Unspecified open wound of unspecified forearm, initial encounter: Secondary | ICD-10-CM | POA: Insufficient documentation

## 2010-04-02 DIAGNOSIS — S71009A Unspecified open wound, unspecified hip, initial encounter: Secondary | ICD-10-CM | POA: Insufficient documentation

## 2010-04-02 DIAGNOSIS — W540XXA Bitten by dog, initial encounter: Secondary | ICD-10-CM | POA: Insufficient documentation

## 2010-04-02 DIAGNOSIS — S71109A Unspecified open wound, unspecified thigh, initial encounter: Secondary | ICD-10-CM | POA: Insufficient documentation

## 2010-04-02 DIAGNOSIS — Z23 Encounter for immunization: Secondary | ICD-10-CM | POA: Insufficient documentation

## 2010-04-05 ENCOUNTER — Encounter: Payer: Self-pay | Admitting: Internal Medicine

## 2010-04-05 ENCOUNTER — Ambulatory Visit (INDEPENDENT_AMBULATORY_CARE_PROVIDER_SITE_OTHER): Payer: Self-pay | Admitting: Internal Medicine

## 2010-04-05 VITALS — BP 120/70 | HR 84 | Temp 98.7°F | Resp 14 | Ht 63.5 in | Wt 107.0 lb

## 2010-04-05 DIAGNOSIS — W540XXA Bitten by dog, initial encounter: Secondary | ICD-10-CM

## 2010-04-05 DIAGNOSIS — S40029A Contusion of unspecified upper arm, initial encounter: Secondary | ICD-10-CM

## 2010-04-05 DIAGNOSIS — T148XXA Other injury of unspecified body region, initial encounter: Secondary | ICD-10-CM

## 2010-04-05 DIAGNOSIS — S40021A Contusion of right upper arm, initial encounter: Secondary | ICD-10-CM

## 2010-04-05 DIAGNOSIS — F988 Other specified behavioral and emotional disorders with onset usually occurring in childhood and adolescence: Secondary | ICD-10-CM

## 2010-04-05 DIAGNOSIS — F909 Attention-deficit hyperactivity disorder, unspecified type: Secondary | ICD-10-CM

## 2010-04-05 MED ORDER — AMPHETAMINE-DEXTROAMPHETAMINE 20 MG PO TABS: 20.0000 mg | ORAL_TABLET | Freq: Two times a day (BID) | ORAL | Status: DC

## 2010-04-05 MED ORDER — MUPIROCIN 2 % EX OINT: TOPICAL_OINTMENT | CUTANEOUS | Status: AC

## 2010-04-05 NOTE — Patient Instructions (Signed)
Continue to keep the suture sites dry monitor for drainage from the areas that we removed the plastic drains from apply the Bactroban ointment to the wounds twice daily and we may keep sterile bandages in place until the sutures are removed should she develop fever streaking of red lines around the wounds or increased soft tissue swelling youshould contact our office

## 2010-04-05 NOTE — Progress Notes (Signed)
Subjective:    Patient ID: Hayley Miranda, female    DOB: [birth year], 30 y.o.   MRN: [id number]  HPI The patient had significant injuries from a dog attack. The attack occurred last Thursday she had lacerations to her right forearm and right leg she has significant soft tissue bruising that persisted today he was placed on appropriate antibiotic with Augmentin 7 days and was given the rabies series since the animal is unknown. He has a drain in place because of the crushing nature of the injury to her forearm and the drainage has stopped. She is also here for routine refill of her Adderall   Review of Systems  Constitutional: Negative for activity change, appetite change and fatigue.  HENT: Negative for ear pain, congestion, neck pain, postnasal drip and sinus pressure.   Eyes: Negative for redness and visual disturbance.  Respiratory: Negative for cough, shortness of breath and wheezing.   Gastrointestinal: Negative for abdominal pain and abdominal distention.  Genitourinary: Negative for dysuria, frequency and menstrual problem.  Musculoskeletal: Negative for myalgias, joint swelling and arthralgias.  Skin: Negative for rash and wound.  Neurological: Negative for dizziness, weakness and headaches.  Hematological: Negative for adenopathy. Does not bruise/bleed easily.  Psychiatric/Behavioral: Negative for sleep disturbance and decreased concentration.   Past Medical History  Diagnosis Date  . Blood transfusion    No past surgical history on file.  reports that she has been smoking Cigarettes.  She has been smoking about .5 packs per day. She does not have any smokeless tobacco history on file. She reports that she drinks alcohol. She reports that she does not use illicit drugs. family history includes Cancer in an unspecified family member; Hyperlipidemia in an unspecified family member; and Hypertension in an unspecified family member. Allergies  Allergen Reactions  . Sulfamethoxazole  W/Trimethoprim     REACTION: rash--fever       Objective:   Physical Exam  Constitutional: She is oriented to person, place, and time. She appears well-developed and well-nourished. No distress.  HENT:  Head: Normocephalic and atraumatic.  Right Ear: External ear normal.  Left Ear: External ear normal.  Nose: Nose normal.  Mouth/Throat: Oropharynx is clear and moist.  Eyes: Conjunctivae and EOM are normal. Pupils are equal, round, and reactive to light.  Neck: Normal range of motion. Neck supple. No JVD present. No tracheal deviation present. No thyromegaly present.  Cardiovascular: Normal rate, regular rhythm, normal heart sounds and intact distal pulses.   No murmur heard. Pulmonary/Chest: Effort normal and breath sounds normal. She has no wheezes. She exhibits no tenderness.  Abdominal: Soft. Bowel sounds are normal.  Musculoskeletal: She exhibits no edema and no tenderness.       The patient has marked lacerations with sutures on her right forearm with soft tissue swelling of the skin due to bruising  Lymphadenopathy:    She has no cervical adenopathy.  Neurological: She is alert and oriented to person, place, and time. She has normal reflexes. No cranial nerve deficit.  Skin: She is not diaphoretic.  Psychiatric: She has a normal mood and affect. Her behavior is normal.          Assessment & Plan:  the patient has marked lacerations to the inside of her right thigh with soft tissue swelling and closure with sutures the drain fell out and she has some induration but I do not think she needs any drainage at this point. The right arm shows induration and lacerations with no evident cellulitis  but there is marked swelling and the bruising of the tissue the drain was pulled sterile dressings placed and dressing changes discussed with the patient. She should return to the emergency room for suture removal I will give her prescription for Bactroban to plate placed on the sutures until  speech removed.

## 2010-04-05 NOTE — Assessment & Plan Note (Signed)
2 month refill of her medication

## 2010-04-12 ENCOUNTER — Emergency Department (HOSPITAL_COMMUNITY)
Admission: EM | Admit: 2010-04-12 | Discharge: 2010-04-12 | Disposition: A | Discharge status: Home / Self Care | Payer: PRIVATE HEALTH INSURANCE | Attending: Emergency Medicine | Admitting: Emergency Medicine

## 2010-04-12 DIAGNOSIS — Z4802 Encounter for removal of sutures: Secondary | ICD-10-CM | POA: Insufficient documentation

## 2010-06-28 ENCOUNTER — Ambulatory Visit: Payer: Self-pay | Admitting: Internal Medicine

## 2010-07-15 ENCOUNTER — Other Ambulatory Visit: Payer: Self-pay | Admitting: *Deleted

## 2010-07-15 DIAGNOSIS — F909 Attention-deficit hyperactivity disorder, unspecified type: Secondary | ICD-10-CM

## 2010-07-15 MED ORDER — AMPHETAMINE-DEXTROAMPHETAMINE 20 MG PO TABS: 20.0000 mg | ORAL_TABLET | Freq: Two times a day (BID) | ORAL | Status: DC

## 2010-08-13 ENCOUNTER — Telehealth: Payer: Self-pay | Admitting: *Deleted

## 2010-08-13 MED ORDER — CEPHALEXIN 500 MG PO CAPS: 500.0000 mg | ORAL_CAPSULE | Freq: Four times a day (QID) | ORAL | Status: AC

## 2010-08-13 NOTE — Telephone Encounter (Signed)
Pt  Is calling asking to see Dr. Lovell Sheehan for a cyst on her jaw line that is enlarging, and is sore and turned blue.

## 2010-08-13 NOTE — Telephone Encounter (Signed)
Give more information as to where the location of the cyst is if it is over the mandible in the area of the parotid gland this is probably parotitis and she can be placed on Keflex 500 mg 4 times a day for 10 days

## 2010-08-13 NOTE — Telephone Encounter (Signed)
Pt states the cyst is about one inch from the ear lobe, and is movable and sore.

## 2010-10-12 ENCOUNTER — Ambulatory Visit (INDEPENDENT_AMBULATORY_CARE_PROVIDER_SITE_OTHER): Payer: PRIVATE HEALTH INSURANCE | Admitting: Internal Medicine

## 2010-10-12 ENCOUNTER — Encounter: Payer: Self-pay | Admitting: Internal Medicine

## 2010-10-12 VITALS — BP 104/70 | HR 80 | Temp 99.4°F | Resp 16 | Ht 63.5 in | Wt 105.0 lb

## 2010-10-12 DIAGNOSIS — F909 Attention-deficit hyperactivity disorder, unspecified type: Secondary | ICD-10-CM

## 2010-10-12 MED ORDER — AMPHETAMINE-DEXTROAMPHETAMINE 20 MG PO TABS: 20.0000 mg | ORAL_TABLET | Freq: Two times a day (BID) | ORAL | Status: DC

## 2010-10-12 NOTE — Patient Instructions (Signed)
Patient was instructed to continue all medications as prescribed. To stop at the checkout desk and schedule a followup appointment  

## 2010-10-12 NOTE — Progress Notes (Signed)
  Subjective:    Patient ID: Hayley Miranda, female    DOB: Feb 13, 1980, 31 y.o.   MRN: [id number]  HPI We discussed increased stresses in her life Medication are stable Weight and blood pressures Reviewed side effects   Review of Systems  Constitutional: Negative for activity change, appetite change and fatigue.  HENT: Negative for ear pain, congestion, neck pain, postnasal drip and sinus pressure.   Eyes: Negative for redness and visual disturbance.  Respiratory: Negative for cough, shortness of breath and wheezing.   Gastrointestinal: Negative for abdominal pain and abdominal distention.  Genitourinary: Negative for dysuria, frequency and menstrual problem.  Musculoskeletal: Negative for myalgias, joint swelling and arthralgias.  Skin: Negative for rash and wound.  Neurological: Negative for dizziness, weakness and headaches.  Hematological: Negative for adenopathy. Does not bruise/bleed easily.  Psychiatric/Behavioral: Negative for sleep disturbance and decreased concentration.       Past Medical History  Diagnosis Date  . Blood transfusion    History reviewed. No pertinent past surgical history.  reports that she has been smoking Cigarettes.  She has been smoking about .5 packs per day. She does not have any smokeless tobacco history on file. She reports that she drinks alcohol. She reports that she does not use illicit drugs. family history includes Cancer in an unspecified family member; Hyperlipidemia in an unspecified family member; and Hypertension in an unspecified family member. Allergies  Allergen Reactions  . Sulfamethoxazole W/Trimethoprim     REACTION: rash--fever    Objective:   Physical Exam  Nursing note and vitals reviewed. Constitutional: She is oriented to person, place, and time. She appears well-developed and well-nourished. No distress.  HENT:  Head: Normocephalic and atraumatic.  Right Ear: External ear normal.  Left Ear: External ear normal.    Nose: Nose normal.  Mouth/Throat: Oropharynx is clear and moist.  Eyes: Conjunctivae and EOM are normal. Pupils are equal, round, and reactive to light.  Neck: Normal range of motion. Neck supple. No JVD present. No tracheal deviation present. No thyromegaly present.  Cardiovascular: Normal rate, regular rhythm, normal heart sounds and intact distal pulses.   No murmur heard. Pulmonary/Chest: Effort normal and breath sounds normal. She has no wheezes. She exhibits no tenderness.  Abdominal: Soft. Bowel sounds are normal.  Musculoskeletal: Normal range of motion. She exhibits no edema and no tenderness.  Lymphadenopathy:    She has no cervical adenopathy.  Neurological: She is alert and oriented to person, place, and time. She has normal reflexes. No cranial nerve deficit.  Skin: Skin is warm and dry. She is not diaphoretic.  Psychiatric: She has a normal mood and affect. Her behavior is normal.          Assessment & Plan:  monitoring of medication Refill of ADD meds

## 2011-01-13 ENCOUNTER — Ambulatory Visit (INDEPENDENT_AMBULATORY_CARE_PROVIDER_SITE_OTHER): Payer: PRIVATE HEALTH INSURANCE | Admitting: Internal Medicine

## 2011-01-13 ENCOUNTER — Encounter: Payer: Self-pay | Admitting: Internal Medicine

## 2011-01-13 ENCOUNTER — Other Ambulatory Visit: Payer: Self-pay | Admitting: *Deleted

## 2011-01-13 VITALS — BP 100/64 | HR 72 | Temp 98.3°F | Resp 16 | Ht 63.5 in | Wt 108.0 lb

## 2011-01-13 DIAGNOSIS — F17200 Nicotine dependence, unspecified, uncomplicated: Secondary | ICD-10-CM

## 2011-01-13 DIAGNOSIS — J4489 Other specified chronic obstructive pulmonary disease: Secondary | ICD-10-CM

## 2011-01-13 DIAGNOSIS — F909 Attention-deficit hyperactivity disorder, unspecified type: Secondary | ICD-10-CM

## 2011-01-13 DIAGNOSIS — J449 Chronic obstructive pulmonary disease, unspecified: Secondary | ICD-10-CM | POA: Insufficient documentation

## 2011-01-13 MED ORDER — AMPHETAMINE-DEXTROAMPHETAMINE 20 MG PO TABS: 20.0000 mg | ORAL_TABLET | Freq: Two times a day (BID) | ORAL | Status: DC

## 2011-01-13 MED ORDER — BROMPHENIRAMINE-PSEUDOEPH 6-45 MG PO TB12: 1.0000 | ORAL_TABLET | Freq: Two times a day (BID) | ORAL | Status: DC

## 2011-01-13 NOTE — Patient Instructions (Signed)
Stop Smoking!  Use Mucinex DM several times a day to help with mucus control and the cough, we'll utilize that continued smoking is taking you down the road of asthmatic bronchitis or chronic obstructive lung disease

## 2011-01-13 NOTE — Progress Notes (Signed)
Subjective:    Patient ID: Hayley Miranda, female    DOB: Jul 10, 1980, 31 y.o.   MRN: [id number]  HPI This is a 31 year old white female who presents for refill of her stable ADHD drugs.  She is acute complaint today of a chronic cough that has lasted for several months.  She continues to abuse tobacco at one pack per day weight and she has been a smoker for over 10 years.  We discussed chronic obstructive lung disease chronic asthmatic bronchitis in the setting of tobacco abuse and a lady with a history of allergies and a history of recurrent upper respiratory tract infections   Review of Systems  Constitutional: Negative for activity change, appetite change and fatigue.  HENT: Positive for congestion and rhinorrhea. Negative for ear pain, neck pain, postnasal drip and sinus pressure.   Eyes: Negative for redness and visual disturbance.  Respiratory: Positive for cough. Negative for shortness of breath and wheezing.   Gastrointestinal: Negative for abdominal pain and abdominal distention.  Genitourinary: Negative for dysuria, frequency and menstrual problem.  Musculoskeletal: Negative for myalgias, joint swelling and arthralgias.  Skin: Negative for rash and wound.  Neurological: Negative for dizziness, weakness and headaches.  Hematological: Negative for adenopathy. Does not bruise/bleed easily.  Psychiatric/Behavioral: Negative for sleep disturbance and decreased concentration.   Past Medical History  Diagnosis Date  . Blood transfusion     History   Social History  . Marital Status: Single    Spouse Name: N/A    Number of Children: N/A  . Years of Education: N/A   Occupational History  . Not on file.   Social History Main Topics  . Smoking status: Current Everyday Smoker -- 0.5 packs/day    Types: Cigarettes  . Smokeless tobacco: Not on file  . Alcohol Use: Yes  . Drug Use: No  . Sexually Active: Yes   Other Topics Concern  . Not on file   Social History  Narrative  . No narrative on file    No past surgical history on file.  Family History  Problem Relation Age of Onset  . Hyperlipidemia      fhx  . Hypertension      fhx  . Cancer      prostate/fhx    Allergies  Allergen Reactions  . Sulfamethoxazole W/Trimethoprim     REACTION: rash--fever    Current Outpatient Prescriptions on File Prior to Visit  Medication Sig Dispense Refill  . mupirocin (BACTROBAN) 2 % ointment Apply to affected area 3 times daily  22 g  0    BP 100/64  Pulse 72  Temp 98.3 F (36.8 C)  Resp 16  Ht 5' 3.5" (1.613 m)  Wt 108 lb (48.988 kg)  BMI 18.83 kg/m2       Objective:   Physical Exam  Nursing note and vitals reviewed.   vital signs reviewed.  She is alert oriented thin white female in no apparent distress except for a chronic hacking nonproductive cough.  There is a smell of cigarette smoke on her clothes pupils are equal round reactive to light and accommodation lungs: Examination revealed diffused minor wheezes that cleared with deep inspiration there was no egophony.  Heart examination was regular rate and rhythm.         Assessment & Plan:  Chronic obstructive lung disease/asthmatic bronchitis Patient has a 10 year one pack per day history of tobacco abuse and she is beginning to show a pattern of frequent upper respiratory tract  infections and chronic daily cough  We discussed the diagnosis of asthmatic bronchitis and its implications in her long-term health she will begin to use Mucinex DM on a daily basis to control secretions and cough if this is unsuccessful or if she is unsuccessful in stopping smoking we will discuss a maintenance inhaler and pulmonary function tests.  She is stable on her current ADHD drugs and a 90 day refill of her medications were given she was assessed for stability of weight and blood pressure.

## 2011-01-19 ENCOUNTER — Other Ambulatory Visit: Payer: Self-pay | Admitting: *Deleted

## 2011-01-19 MED ORDER — CETIRIZINE-PSEUDOEPHEDRINE ER 5-120 MG PO TB12: 1.0000 | ORAL_TABLET | Freq: Two times a day (BID) | ORAL | Status: DC

## 2011-04-14 ENCOUNTER — Ambulatory Visit: Payer: PRIVATE HEALTH INSURANCE | Admitting: Internal Medicine

## 2011-05-20 ENCOUNTER — Ambulatory Visit (INDEPENDENT_AMBULATORY_CARE_PROVIDER_SITE_OTHER): Payer: BC Managed Care – PPO | Admitting: Internal Medicine

## 2011-05-20 ENCOUNTER — Encounter: Payer: Self-pay | Admitting: Internal Medicine

## 2011-05-20 VITALS — HR 72 | Temp 98.3°F | Resp 16 | Ht 63.5 in | Wt 108.0 lb

## 2011-05-20 DIAGNOSIS — J019 Acute sinusitis, unspecified: Secondary | ICD-10-CM

## 2011-05-20 DIAGNOSIS — F909 Attention-deficit hyperactivity disorder, unspecified type: Secondary | ICD-10-CM

## 2011-05-20 MED ORDER — AMPHETAMINE-DEXTROAMPHETAMINE 20 MG PO TABS: 20.0000 mg | ORAL_TABLET | Freq: Two times a day (BID) | ORAL | Status: DC

## 2011-05-20 MED ORDER — LEVOFLOXACIN 500 MG PO TABS: 500.0000 mg | ORAL_TABLET | Freq: Every day | ORAL | Status: AC

## 2011-05-20 NOTE — Patient Instructions (Signed)
The patient is instructed to continue all medications as prescribed. Schedule followup with check out clerk upon leaving the clinic  

## 2011-05-20 NOTE — Progress Notes (Signed)
  Subjective:    Patient ID: Hayley Miranda, female    DOB: 10/21/1980, 31 y.o.   MRN: [id number]  HPI 1 weeks hx of sinus conjestion and fever Hx of recurrent sinus infections Head ache, allergy hx and smoking   Review of Systems  Constitutional: Negative for activity change, appetite change and fatigue.  HENT: Positive for ear pain, nosebleeds, congestion and tinnitus. Negative for neck pain, postnasal drip and sinus pressure.   Eyes: Negative for redness and visual disturbance.  Respiratory: Negative for cough, shortness of breath and wheezing.   Gastrointestinal: Negative for abdominal pain and abdominal distention.  Genitourinary: Negative for dysuria, frequency and menstrual problem.  Musculoskeletal: Negative for myalgias, joint swelling and arthralgias.  Skin: Negative for rash and wound.  Neurological: Negative for dizziness, weakness and headaches.  Hematological: Negative for adenopathy. Does not bruise/bleed easily.  Psychiatric/Behavioral: Negative for sleep disturbance and decreased concentration.       Objective:   Physical Exam  Nursing note and vitals reviewed. Constitutional: She is oriented to person, place, and time. She appears well-developed and well-nourished. No distress.  HENT:  Head: Normocephalic and atraumatic.       Red angry turbinates with white glistening discharge  Eyes: Conjunctivae and EOM are normal. Pupils are equal, round, and reactive to light.  Neck: Normal range of motion. Neck supple. No JVD present. No tracheal deviation present. No thyromegaly present.  Cardiovascular: Normal rate, regular rhythm, normal heart sounds and intact distal pulses.   No murmur heard. Pulmonary/Chest: Effort normal and breath sounds normal. She has no wheezes. She exhibits no tenderness.  Abdominal: Soft. Bowel sounds are normal.  Musculoskeletal: Normal range of motion. She exhibits no edema and no tenderness.  Lymphadenopathy:    She has no cervical  adenopathy.  Neurological: She is alert and oriented to person, place, and time. She has normal reflexes. No cranial nerve deficit.  Skin: Skin is warm and dry. She is not diaphoretic.  Psychiatric: She has a normal mood and affect. Her behavior is normal.          Assessment & Plan:  Acute sinusitis on chronic allergic rhinitis Will treat with an antibiotic for a 7 day protocol

## 2011-08-24 ENCOUNTER — Encounter: Payer: Self-pay | Admitting: Internal Medicine

## 2011-08-24 ENCOUNTER — Ambulatory Visit (INDEPENDENT_AMBULATORY_CARE_PROVIDER_SITE_OTHER): Payer: BC Managed Care – PPO | Admitting: Internal Medicine

## 2011-08-24 VITALS — BP 110/70 | HR 72 | Temp 98.2°F | Resp 16 | Ht 63.5 in | Wt 108.0 lb

## 2011-08-24 DIAGNOSIS — F988 Other specified behavioral and emotional disorders with onset usually occurring in childhood and adolescence: Secondary | ICD-10-CM

## 2011-08-24 MED ORDER — AMPHETAMINE-DEXTROAMPHETAMINE 20 MG PO TABS: 20.0000 mg | ORAL_TABLET | Freq: Two times a day (BID) | ORAL | Status: DC

## 2011-08-24 MED ORDER — CETIRIZINE-PSEUDOEPHEDRINE ER 5-120 MG PO TB12: 1.0000 | ORAL_TABLET | Freq: Two times a day (BID) | ORAL | Status: AC

## 2011-08-24 NOTE — Progress Notes (Signed)
  Subjective:    Patient ID: Hayley Miranda, female    DOB: [birth year], 31 y.o.   MRN: [id number]  HPI  Patient is 31 year old female with ADD presents for protocol refill of her Adderall.  She is tolerating the medications well as will her weight is stable.  She continues to smoke we urged smoking cessation however at this time because she's dealing with several stresses in her life probably not the most appropriate time to begin a smoking cessation program however we will continue to work with her on this subject  Review of Systems  Constitutional: Negative for activity change, appetite change and fatigue.  HENT: Negative for ear pain, congestion, neck pain, postnasal drip and sinus pressure.   Eyes: Negative for redness and visual disturbance.  Respiratory: Negative for cough, shortness of breath and wheezing.   Gastrointestinal: Negative for abdominal pain and abdominal distention.  Genitourinary: Negative for dysuria, frequency and menstrual problem.  Musculoskeletal: Negative for myalgias, joint swelling and arthralgias.  Skin: Negative for rash and wound.  Neurological: Negative for dizziness, weakness and headaches.  Hematological: Negative for adenopathy. Does not bruise/bleed easily.  Psychiatric/Behavioral: Negative for disturbed wake/sleep cycle and decreased concentration.       Objective:   Physical Exam  Nursing note and vitals reviewed. Constitutional: She is oriented to person, place, and time. She appears well-developed and well-nourished. No distress.  HENT:  Head: Normocephalic and atraumatic.  Right Ear: External ear normal.  Left Ear: External ear normal.  Nose: Nose normal.  Mouth/Throat: Oropharynx is clear and moist.  Eyes: Conjunctivae and EOM are normal. Pupils are equal, round, and reactive to light.  Neck: Normal range of motion. Neck supple. No JVD present. No tracheal deviation present. No thyromegaly present.  Cardiovascular: Normal rate, regular  rhythm, normal heart sounds and intact distal pulses.   No murmur heard. Pulmonary/Chest: Effort normal and breath sounds normal. She has no wheezes. She exhibits no tenderness.  Abdominal: Soft. Bowel sounds are normal.  Musculoskeletal: Normal range of motion. She exhibits no edema and no tenderness.  Lymphadenopathy:    She has no cervical adenopathy.  Neurological: She is alert and oriented to person, place, and time. She has normal reflexes. No cranial nerve deficit.  Skin: Skin is warm and dry. She is not diaphoretic.  Psychiatric: She has a normal mood and affect. Her behavior is normal.          Assessment & Plan:  ADD refill by protocol

## 2011-08-24 NOTE — Patient Instructions (Addendum)
The patient is instructed to continue all medications as prescribed. Schedule followup with check out clerk upon leaving the clinic  

## 2011-11-23 ENCOUNTER — Ambulatory Visit (INDEPENDENT_AMBULATORY_CARE_PROVIDER_SITE_OTHER): Payer: BC Managed Care – PPO | Admitting: Internal Medicine

## 2011-11-23 ENCOUNTER — Encounter: Payer: Self-pay | Admitting: Internal Medicine

## 2011-11-23 VITALS — BP 130/80 | HR 72 | Temp 98.2°F | Resp 16 | Ht 63.5 in | Wt 106.0 lb

## 2011-11-23 DIAGNOSIS — F988 Other specified behavioral and emotional disorders with onset usually occurring in childhood and adolescence: Secondary | ICD-10-CM

## 2011-11-23 DIAGNOSIS — F17200 Nicotine dependence, unspecified, uncomplicated: Secondary | ICD-10-CM

## 2011-11-23 MED ORDER — AMPHETAMINE-DEXTROAMPHETAMINE 20 MG PO TABS: 20.0000 mg | ORAL_TABLET | Freq: Two times a day (BID) | ORAL | Status: DC

## 2011-11-23 NOTE — Patient Instructions (Addendum)
The patient is instructed to continue all medications as prescribed. Schedule followup with check out clerk upon leaving the clinic  

## 2012-01-15 NOTE — Progress Notes (Signed)
  Subjective:    Patient ID: Hayley Miranda, female    DOB: Nov 12, 1980, 32 y.o.   MRN: [id number]  HPI ADD medication mangement visit Still smoking Increased anxiety over boyfriends drinking   Review of Systems  Constitutional: Negative for activity change, appetite change and fatigue.  HENT: Negative for ear pain, congestion, neck pain, postnasal drip and sinus pressure.   Eyes: Negative for redness and visual disturbance.  Respiratory: Negative for cough, shortness of breath and wheezing.   Gastrointestinal: Negative for abdominal pain and abdominal distention.  Genitourinary: Negative for dysuria, frequency and menstrual problem.  Musculoskeletal: Negative for myalgias, joint swelling and arthralgias.  Skin: Negative for rash and wound.  Neurological: Negative for dizziness, weakness and headaches.  Hematological: Negative for adenopathy. Does not bruise/bleed easily.  Psychiatric/Behavioral: Negative for sleep disturbance and decreased concentration.       Objective:   Physical Exam  Nursing note and vitals reviewed. Constitutional: She appears well-developed and well-nourished. No distress.  HENT:  Head: Normocephalic and atraumatic.  Right Ear: External ear normal.  Left Ear: External ear normal.  Nose: Nose normal.  Mouth/Throat: Oropharynx is clear and moist.  Eyes: Conjunctivae normal and EOM are normal. Pupils are equal, round, and reactive to light.  Neck: Normal range of motion. Neck supple. No JVD present. No tracheal deviation present. No thyromegaly present.  Cardiovascular: Normal rate, regular rhythm, normal heart sounds and intact distal pulses.   No murmur heard. Pulmonary/Chest: Effort normal and breath sounds normal. She has no wheezes. She exhibits no tenderness.  Abdominal: Soft. Bowel sounds are normal.  Lymphadenopathy:    She has no cervical adenopathy.  Neurological: She has normal reflexes. No cranial nerve deficit.  Skin: She is not diaphoretic.            Assessment & Plan:  Stop smking ADD refills per protocol stable

## 2012-02-24 ENCOUNTER — Encounter: Payer: Self-pay | Admitting: Internal Medicine

## 2012-02-24 ENCOUNTER — Ambulatory Visit (INDEPENDENT_AMBULATORY_CARE_PROVIDER_SITE_OTHER): Payer: Self-pay | Admitting: Internal Medicine

## 2012-02-24 VITALS — BP 130/90 | HR 76 | Temp 98.2°F | Resp 16 | Ht 63.5 in | Wt 109.0 lb

## 2012-02-24 DIAGNOSIS — F988 Other specified behavioral and emotional disorders with onset usually occurring in childhood and adolescence: Secondary | ICD-10-CM

## 2012-02-24 MED ORDER — AMPHETAMINE-DEXTROAMPHETAMINE 20 MG PO TABS: 20.0000 mg | ORAL_TABLET | Freq: Three times a day (TID) | ORAL | Status: DC

## 2012-02-24 MED ORDER — AMPHETAMINE-DEXTROAMPHETAMINE 20 MG PO TABS: 20.0000 mg | ORAL_TABLET | Freq: Two times a day (BID) | ORAL | Status: DC

## 2012-02-24 NOTE — Progress Notes (Signed)
Subjective:    Patient ID: Hayley Miranda, female    DOB: [birth year], 32 y.o.   MRN: [id number]  HPI Was evaluated by psychitry for increased ADD dosing and Dr Evelene Croon increased dose to TID Will continue to refill per portocol   Review of Systems  Constitutional: Negative for activity change, appetite change and fatigue.  HENT: Negative for ear pain, congestion, neck pain, postnasal drip and sinus pressure.   Eyes: Negative for redness and visual disturbance.  Respiratory: Negative for cough, shortness of breath and wheezing.   Gastrointestinal: Negative for abdominal pain and abdominal distention.  Genitourinary: Negative for dysuria, frequency and menstrual problem.  Musculoskeletal: Negative for myalgias, joint swelling and arthralgias.  Skin: Negative for rash and wound.  Neurological: Negative for dizziness, weakness and headaches.  Hematological: Negative for adenopathy. Does not bruise/bleed easily.  Psychiatric/Behavioral: Negative for sleep disturbance and decreased concentration.       Past Medical History  Diagnosis Date  . Blood transfusion     History   Social History  . Marital Status: Single    Spouse Name: N/A    Number of Children: N/A  . Years of Education: N/A   Occupational History  . Not on file.   Social History Main Topics  . Smoking status: Current Every Day Smoker -- 0.50 packs/day    Types: Cigarettes  . Smokeless tobacco: Not on file  . Alcohol Use: Yes  . Drug Use: No  . Sexually Active: Yes   Other Topics Concern  . Not on file   Social History Narrative  . No narrative on file    No past surgical history on file.  Family History  Problem Relation Age of Onset  . Hyperlipidemia      fhx  . Hypertension      fhx  . Cancer      prostate/fhx    Allergies  Allergen Reactions  . Sulfamethoxazole W-Trimethoprim     REACTION: rash--fever    Current Outpatient Prescriptions on File Prior to Visit  Medication Sig Dispense  Refill  . cetirizine-pseudoephedrine (ZYRTEC-D) 5-120 MG per tablet Take 1 tablet by mouth 2 (two) times daily.  60 tablet  6   No current facility-administered medications on file prior to visit.    BP 130/90  Pulse 76  Temp(Src) 98.2 F (36.8 C)  Resp 16  Ht 5' 3.5" (1.613 m)  Wt 109 lb (49.442 kg)  BMI 19 kg/m2    Objective:   Physical Exam  Nursing note and vitals reviewed. Constitutional: She is oriented to person, place, and time. She appears well-developed and well-nourished. No distress.  HENT:  Head: Normocephalic and atraumatic.  Right Ear: External ear normal.  Left Ear: External ear normal.  Nose: Nose normal.  Mouth/Throat: Oropharynx is clear and moist.  Eyes: Conjunctivae and EOM are normal. Pupils are equal, round, and reactive to light.  Neck: Normal range of motion. Neck supple. No JVD present. No tracheal deviation present. No thyromegaly present.  Cardiovascular: Normal rate, regular rhythm, normal heart sounds and intact distal pulses.   No murmur heard. Pulmonary/Chest: Effort normal and breath sounds normal. She has no wheezes. She exhibits no tenderness.  Abdominal: Soft. Bowel sounds are normal.  Musculoskeletal: Normal range of motion. She exhibits no edema and no tenderness.  Lymphadenopathy:    She has no cervical adenopathy.  Neurological: She is alert and oriented to person, place, and time. She has normal reflexes. No cranial nerve deficit.  Skin: Skin  is warm and dry. She is not diaphoretic.  Psychiatric: She has a normal mood and affect. Her behavior is normal.          Assessment & Plan:  ADD protocols Reviewed results of consult Medication management Has been to Counsellor  Has increased hyperavtive ADD

## 2012-02-24 NOTE — Patient Instructions (Addendum)
The patient is instructed to continue all medications as prescribed. Schedule followup with check out clerk upon leaving the clinic  

## 2012-05-22 ENCOUNTER — Telehealth: Payer: Self-pay | Admitting: Internal Medicine

## 2012-05-22 DIAGNOSIS — F988 Other specified behavioral and emotional disorders with onset usually occurring in childhood and adolescence: Secondary | ICD-10-CM

## 2012-05-22 MED ORDER — AMPHETAMINE-DEXTROAMPHETAMINE 20 MG PO TABS: 20.0000 mg | ORAL_TABLET | Freq: Three times a day (TID) | ORAL | Status: DC

## 2012-05-22 NOTE — Telephone Encounter (Signed)
Patient called stating that she need a refill of her adderall 20 mg 1potid. Please assist.  °

## 2012-05-22 NOTE — Telephone Encounter (Signed)
Printed and pt w ill be called to pick up after dr Lovell Sheehan signs

## 2012-06-08 ENCOUNTER — Telehealth: Payer: Self-pay | Admitting: Internal Medicine

## 2012-06-08 ENCOUNTER — Ambulatory Visit: Payer: 59 | Admitting: Internal Medicine

## 2012-06-08 DIAGNOSIS — F988 Other specified behavioral and emotional disorders with onset usually occurring in childhood and adolescence: Secondary | ICD-10-CM

## 2012-06-08 MED ORDER — AMPHETAMINE-DEXTROAMPHETAMINE 20 MG PO TABS: 20.0000 mg | ORAL_TABLET | Freq: Three times a day (TID) | ORAL | Status: DC

## 2012-06-08 NOTE — Telephone Encounter (Signed)
Pt needs new rx generic adderall 20 mg three pills a day#90. Pt is requesting 3 separate rx for next 3 months. Pt had appt today and had to rsc.

## 2012-06-08 NOTE — Telephone Encounter (Signed)
Hayley Miranda call pt to pick up when dr j enkins sign this am

## 2012-09-14 ENCOUNTER — Telehealth: Payer: Self-pay | Admitting: Internal Medicine

## 2012-09-14 NOTE — Telephone Encounter (Signed)
Pt needs new rx generic adderal 20 mg three times a day

## 2012-09-17 ENCOUNTER — Other Ambulatory Visit: Payer: Self-pay | Admitting: *Deleted

## 2012-09-17 DIAGNOSIS — F988 Other specified behavioral and emotional disorders with onset usually occurring in childhood and adolescence: Secondary | ICD-10-CM

## 2012-09-17 MED ORDER — AMPHETAMINE-DEXTROAMPHETAMINE 20 MG PO TABS: 20.0000 mg | ORAL_TABLET | Freq: Three times a day (TID) | ORAL | Status: DC

## 2012-09-17 NOTE — Telephone Encounter (Signed)
Pt informed ready for pick up 

## 2012-10-08 ENCOUNTER — Encounter: Payer: Self-pay | Admitting: Internal Medicine

## 2012-10-08 ENCOUNTER — Ambulatory Visit (INDEPENDENT_AMBULATORY_CARE_PROVIDER_SITE_OTHER): Payer: 59 | Admitting: Internal Medicine

## 2012-10-08 VITALS — BP 110/70 | HR 80 | Temp 98.2°F | Resp 16 | Ht 63.5 in | Wt 112.0 lb

## 2012-10-08 DIAGNOSIS — J45909 Unspecified asthma, uncomplicated: Secondary | ICD-10-CM

## 2012-10-08 DIAGNOSIS — L738 Other specified follicular disorders: Secondary | ICD-10-CM

## 2012-10-08 DIAGNOSIS — F988 Other specified behavioral and emotional disorders with onset usually occurring in childhood and adolescence: Secondary | ICD-10-CM

## 2012-10-08 DIAGNOSIS — F17200 Nicotine dependence, unspecified, uncomplicated: Secondary | ICD-10-CM

## 2012-10-08 DIAGNOSIS — Z23 Encounter for immunization: Secondary | ICD-10-CM

## 2012-10-08 DIAGNOSIS — L853 Xerosis cutis: Secondary | ICD-10-CM

## 2012-10-08 NOTE — Progress Notes (Signed)
  Subjective:    Patient ID: Hayley Miranda, female    DOB: 03-14-1980, 32 y.o.   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    Review of Systems  Constitutional: Negative for activity change, appetite change and fatigue.  HENT: Negative for ear pain, congestion, neck pain, postnasal drip and sinus pressure.   Eyes: Negative for redness and visual disturbance.  Respiratory: Negative for cough, shortness of breath and wheezing.   Gastrointestinal: Negative for abdominal pain and abdominal distention.  Genitourinary: Negative for dysuria, frequency and menstrual problem.  Musculoskeletal: Negative for myalgias, joint swelling and arthralgias.  Skin: Negative for rash and wound.  Neurological: Negative for dizziness, weakness and headaches.  Hematological: Negative for adenopathy. Does not bruise/bleed easily.  Psychiatric/Behavioral: Negative for sleep disturbance and decreased concentration.       Objective:   Physical Exam  Constitutional: She is oriented to person, place, and time. She appears well-developed and well-nourished. No distress.  HENT:  Head: Normocephalic and atraumatic.  Right Ear: External ear normal.  Left Ear: External ear normal.  Nose: Nose normal.  Mouth/Throat: Oropharynx is clear and moist.  Eyes: Conjunctivae and EOM are normal. Pupils are equal, round, and reactive to light.  Neck: Normal range of motion. Neck supple. No JVD present. No tracheal deviation present. No thyromegaly present.  Cardiovascular: Normal rate, regular rhythm, normal heart sounds and intact distal pulses.   No murmur heard. Pulmonary/Chest: Effort normal and breath sounds normal. She has no wheezes. She exhibits no tenderness.  Abdominal: Soft. Bowel sounds are normal.  Musculoskeletal: Normal range of motion. She exhibits no edema and  no tenderness.  Lymphadenopathy:    She has no cervical adenopathy.  Neurological: She is alert and oriented to person, place, and time. She has normal reflexes. No cranial nerve deficit.  Skin: Skin is warm and dry. She is not diaphoretic.  Psychiatric: She has a normal mood and affect. Her behavior is normal.          Assessment & Plan:  Use of Eucerin with oatmeal and   And Vaseline at nighttime sarna lotion  ADD medications are set

## 2012-10-08 NOTE — Patient Instructions (Addendum)
Eucerin  With oatmeal  Hydration And work environment  vasiline and sarna with lotion at night    sarna lotion and eucerin together  May get refill in 60 day Call one week before they are due

## 2012-12-18 ENCOUNTER — Telehealth: Payer: Self-pay | Admitting: Internal Medicine

## 2012-12-18 ENCOUNTER — Other Ambulatory Visit: Payer: Self-pay | Admitting: *Deleted

## 2012-12-18 DIAGNOSIS — F988 Other specified behavioral and emotional disorders with onset usually occurring in childhood and adolescence: Secondary | ICD-10-CM

## 2012-12-18 MED ORDER — AMPHETAMINE-DEXTROAMPHETAMINE 20 MG PO TABS: 20.0000 mg | ORAL_TABLET | Freq: Three times a day (TID) | ORAL | Status: DC

## 2012-12-18 NOTE — Telephone Encounter (Signed)
Printed and will call pt to pick up tomorrow after dr jenkins signs 

## 2012-12-18 NOTE — Telephone Encounter (Signed)
Pt request refill amphetamine-dextroamphetamine (ADDERALL) 20 MG  1 / tid 3 mo supply

## 2013-03-12 ENCOUNTER — Telehealth: Payer: Self-pay | Admitting: Internal Medicine

## 2013-03-12 DIAGNOSIS — F988 Other specified behavioral and emotional disorders with onset usually occurring in childhood and adolescence: Secondary | ICD-10-CM

## 2013-03-12 MED ORDER — AMPHETAMINE-DEXTROAMPHETAMINE 20 MG PO TABS: 20.0000 mg | ORAL_TABLET | Freq: Three times a day (TID) | ORAL | Status: DC

## 2013-03-12 NOTE — Telephone Encounter (Signed)
Ok per Dr Lovell SheehanJenkins, rx ready for p/u tomorrow, pt aware

## 2013-03-12 NOTE — Telephone Encounter (Signed)
Pt is needing new rx amphetamine-dextroamphetamine (ADDERALL) 20 MG tablet, please call when available for pick up. ° °

## 2013-04-07 ENCOUNTER — Encounter (HOSPITAL_COMMUNITY): Payer: Self-pay | Admitting: Emergency Medicine

## 2013-04-07 ENCOUNTER — Emergency Department (HOSPITAL_COMMUNITY): Payer: 59

## 2013-04-07 ENCOUNTER — Emergency Department (HOSPITAL_COMMUNITY)
Admission: EM | Admit: 2013-04-07 | Discharge: 2013-04-07 | Disposition: A | Discharge status: Home / Self Care | Payer: 59 | Attending: Emergency Medicine | Admitting: Emergency Medicine

## 2013-04-07 DIAGNOSIS — F17200 Nicotine dependence, unspecified, uncomplicated: Secondary | ICD-10-CM | POA: Insufficient documentation

## 2013-04-07 DIAGNOSIS — Y939 Activity, unspecified: Secondary | ICD-10-CM | POA: Insufficient documentation

## 2013-04-07 DIAGNOSIS — S43109A Unspecified dislocation of unspecified acromioclavicular joint, initial encounter: Secondary | ICD-10-CM | POA: Insufficient documentation

## 2013-04-07 DIAGNOSIS — W101XXA Fall (on)(from) sidewalk curb, initial encounter: Secondary | ICD-10-CM | POA: Insufficient documentation

## 2013-04-07 DIAGNOSIS — Z79899 Other long term (current) drug therapy: Secondary | ICD-10-CM | POA: Insufficient documentation

## 2013-04-07 DIAGNOSIS — Y9289 Other specified places as the place of occurrence of the external cause: Secondary | ICD-10-CM | POA: Insufficient documentation

## 2013-04-07 MED ORDER — IBUPROFEN 800 MG PO TABS: 800.0000 mg | ORAL_TABLET | Freq: Three times a day (TID) | ORAL | Status: DC | PRN

## 2013-04-07 MED ORDER — HYDROCODONE-ACETAMINOPHEN 5-325 MG PO TABS
1.0000 | ORAL_TABLET | Freq: Once | ORAL | Status: AC
  Administered 2013-04-07: 1 via ORAL
  Filled 2013-04-07: qty 1

## 2013-04-07 MED ORDER — HYDROCODONE-ACETAMINOPHEN 5-325 MG PO TABS: 1.0000 | ORAL_TABLET | Freq: Four times a day (QID) | ORAL | Status: DC | PRN

## 2013-04-07 NOTE — Discharge Instructions (Signed)
Return here as needed. Follow up with your orthopedist. Use ice on your shoulder.

## 2013-04-07 NOTE — ED Notes (Addendum)
Pt [place] the [place].  States hx of surgery on rt shoulder.  C/o pain in that shoulder after fall.  No deformity noted.

## 2013-04-07 NOTE — ED Provider Notes (Signed)
CSN: [id number]     Arrival date & time 04/07/13  1735 History   First MD Initiated Contact with Patient 04/07/13 1809     Chief Complaint  Patient presents with  . Shoulder Injury     (Consider location/radiation/quality/duration/timing/severity/associated sxs/prior Treatment) HPI Patient presents to the emergency department with right shoulder pain following a fall that occurred last night.  The patient, states she tripped over a curb.  Patient, states she ran landed on her right shoulder.  She states, that she's had problems with his shoulder in the past.  She states she was supposed to have surgery but has not done so yet.  Patient denies loss consciousness, nausea, vomiting, back pain, neck pain, weakness, dizziness, headache, or syncope.  The patient, states, that she did not take any medications prior to arrival.  She states that movement and palpation make the pain, worse Past Medical History  Diagnosis Date  . Blood transfusion    No past surgical history on file. Family History  Problem Relation Age of Onset  . Hyperlipidemia Father     fhx  . Hypertension Father     fhx  . Cancer      prostate/fhx   History  Substance Use Topics  . Smoking status: Current Every Day Smoker -- 0.50 packs/day    Types: Cigarettes  . Smokeless tobacco: Not on file  . Alcohol Use: Yes   OB History   Grav Para Term Preterm Abortions TAB SAB Ect Mult Living                 Review of Systems All other systems negative except as documented in the HPI. All pertinent positives and negatives as reviewed in the HPI.   Allergies  Sulfamethoxazole-trimethoprim  Home Medications   Current Outpatient Rx  Name  Route  Sig  Dispense  Refill  . amphetamine-dextroamphetamine (ADDERALL) 20 MG tablet   Oral   Take 1 tablet (20 mg total) by mouth 3 (three) times daily.   90 tablet   0    BP 138/83  Pulse 89  Temp(Src) 98.9 F (37.2 C) (Oral)  Resp 16  SpO2 100%  LMP  04/04/2013 Physical Exam  Nursing note and vitals reviewed. Constitutional: She is oriented to person, place, and time. She appears well-developed and well-nourished.  HENT:  Head: Normocephalic and atraumatic.  Eyes: Pupils are equal, round, and reactive to light.  Musculoskeletal:       Right shoulder: She exhibits decreased range of motion, tenderness and pain. She exhibits no swelling, no crepitus, no deformity, no spasm and normal pulse.  Neurological: She is alert and oriented to person, place, and time. She exhibits normal muscle tone. Coordination normal.  Skin: Skin is warm and dry.    ED Course  Procedures (including critical care time) Labs Review Labs Reviewed - No data to display Imaging Review Dg Shoulder Right  04/07/2013   CLINICAL DATA:  Right shoulder pain secondary to a fall.  EXAM: RIGHT SHOULDER - 2+ VIEW  COMPARISON:  None.  FINDINGS: The glenohumeral joint appears normal. There are cystic degenerative changes in the greater tuberosity of the proximal humerus. The distal clavicle appears subluxed superiorly with widening of the acromioclavicular joint. The possibility of AC joint separation should be considered.  IMPRESSION: Possible AC joint separation.   Electronically Signed   By: Geanie CooleyJim  Maxwell M.D.   On: 04/07/2013 18:41    Patient be referred to her orthopedist for further evaluation.  Told to  return here as needed.  She is explained that there is a.  A.c. joint separation noted on x-ray.  Told to use ice, on the area.  Patient has normal sensation and pulses in her extremity    Carlyle Dolly, PA-C 04/07/13 1912

## 2013-04-08 ENCOUNTER — Telehealth: Payer: Self-pay | Admitting: Internal Medicine

## 2013-04-08 DIAGNOSIS — F988 Other specified behavioral and emotional disorders with onset usually occurring in childhood and adolescence: Secondary | ICD-10-CM

## 2013-04-08 NOTE — Telephone Encounter (Signed)
Just a reminder, I left message for this pt to resc appt for Wed. You will refill their ADD meds until we can get pt in for appt.

## 2013-04-09 NOTE — ED Provider Notes (Signed)
Medical screening examination/treatment/procedure(s) were performed by non-physician practitioner and as supervising physician I was immediately available for consultation/collaboration.   Thomson Herbers T Jahleah Mariscal, MD 04/09/13 1729 

## 2013-04-09 NOTE — Telephone Encounter (Signed)
Pt aware no appt on wed. Pt states she will be out of amphetamine-dextroamphetamine (ADDERALL) 20 MG tablet On Sunday. Needs to pu by Friday. 90 day ok?

## 2013-04-10 ENCOUNTER — Ambulatory Visit: Payer: 59 | Admitting: Internal Medicine

## 2013-04-10 MED ORDER — AMPHETAMINE-DEXTROAMPHETAMINE 20 MG PO TABS: 20.0000 mg | ORAL_TABLET | Freq: Three times a day (TID) | ORAL | Status: DC

## 2013-04-10 NOTE — Telephone Encounter (Signed)
Ok per Dr Lovell SheehanJenkins, rx will be ready for p/u on Friday, pt aware to p/u after lunch

## 2013-05-13 ENCOUNTER — Telehealth: Payer: Self-pay | Admitting: Internal Medicine

## 2013-05-13 DIAGNOSIS — F988 Other specified behavioral and emotional disorders with onset usually occurring in childhood and adolescence: Secondary | ICD-10-CM

## 2013-05-13 MED ORDER — AMPHETAMINE-DEXTROAMPHETAMINE 20 MG PO TABS: 20.0000 mg | ORAL_TABLET | Freq: Three times a day (TID) | ORAL | Status: DC

## 2013-05-13 NOTE — Telephone Encounter (Signed)
Pt needs new rx generic adderall 20 mg #90. Pt would like rxs for next 3 months °

## 2013-05-13 NOTE — Telephone Encounter (Signed)
Rx ready for pick. Left message on machine for patient.  Due to printer error 4 prescriptions were printed.  3 were given to patient and 1 was shredded.

## 2013-09-16 ENCOUNTER — Telehealth: Payer: Self-pay | Admitting: Family Medicine

## 2013-09-16 ENCOUNTER — Ambulatory Visit: Payer: 59 | Admitting: Family Medicine

## 2013-09-16 ENCOUNTER — Encounter: Payer: Self-pay | Admitting: Family Medicine

## 2013-09-16 NOTE — Telephone Encounter (Signed)
Please see below Dr. Hunter  

## 2013-09-16 NOTE — Telephone Encounter (Signed)
Patient would like to have her Adderall prescription renewed until her new patient appt in Feb.

## 2013-09-16 NOTE — Telephone Encounter (Signed)
She missed her appointment today. She will need to be fit into schedule for refill only appointment focusing on ADHD.

## 2013-09-17 NOTE — Telephone Encounter (Signed)
Pt scheduled  

## 2013-09-17 NOTE — Telephone Encounter (Signed)
Mrs. Hayley Miranda can you call pt and work her into a 15 min slot for a refill only appt?

## 2013-09-18 ENCOUNTER — Encounter: Payer: Self-pay | Admitting: Family Medicine

## 2013-09-18 ENCOUNTER — Ambulatory Visit (INDEPENDENT_AMBULATORY_CARE_PROVIDER_SITE_OTHER): Payer: 59 | Admitting: Family Medicine

## 2013-09-18 VITALS — BP 100/68 | HR 88 | Temp 97.5°F | Ht 63.0 in | Wt 113.0 lb

## 2013-09-18 DIAGNOSIS — F17200 Nicotine dependence, unspecified, uncomplicated: Secondary | ICD-10-CM

## 2013-09-18 DIAGNOSIS — Z23 Encounter for immunization: Secondary | ICD-10-CM

## 2013-09-18 DIAGNOSIS — F988 Other specified behavioral and emotional disorders with onset usually occurring in childhood and adolescence: Secondary | ICD-10-CM

## 2013-09-18 MED ORDER — AMPHETAMINE-DEXTROAMPHETAMINE 20 MG PO TABS
20.0000 mg | ORAL_TABLET | Freq: Three times a day (TID) | ORAL | Status: DC
Start: 2013-09-18 — End: 2013-12-09

## 2013-09-18 MED ORDER — AMPHETAMINE-DEXTROAMPHETAMINE 20 MG PO TABS: 20.0000 mg | ORAL_TABLET | Freq: Three times a day (TID) | ORAL | Status: DC

## 2013-09-18 NOTE — Patient Instructions (Addendum)
We have given you 3 months of adderrall refills. I am glad your symptoms are well controlled.   Advise you to quit smoking  Thanks for your time, Dr. Durene Cal  Health Maintenance Due  Topic Date Due  . Pap Smear -have them fax [place] your records 08/29/1996  . Influenza Vaccine -today 08/03/2013

## 2013-09-18 NOTE — Progress Notes (Signed)
  Tana Conch, MD Phone: [phone]  Subjective:   Hayley Miranda is a 33 y.o. year old very pleasant female patient who presents with the following:  Adhd-controlled Diagnosed in 6th grade. Was on ritalin through HS. Had trial of dexadrine and another medication which she cannot remember. Trialed off of medicine but did not do well. Doing well at work-takes one in AM at 8 am, at lunch 12:30, at home 5 or 5:30. Has been stable on dose for years ROS-no palpitations or unintentional weight loss  Tobacco abuse 1 ppd. Not interested in quitting. Not clear why COPD on list.  ROS-no chest pain or shortness of breath  Past Medical History- Patient Active Problem List   Diagnosis Date Noted  . COPD (chronic obstructive pulmonary disease) with chronic bronchitis 01/13/2011    Priority: High  . CIGARETTE SMOKER 03/03/2008    Priority: High  . ATTENTION DEFICIT DISORDER, ADULT 02/01/2007    Priority: Medium  . CELLULITIS, FOOT, LEFT 09/16/2008    Priority: Low  . ALLERGIC RHINITIS CAUSE UNSPECIFIED 03/03/2008    Priority: Low  . Dermatophytosis of nail 02/01/2007    Priority: Low   Medications- reviewed and updated Current Outpatient Prescriptions  Medication Sig Dispense Refill  . amphetamine-dextroamphetamine (ADDERALL) 20 MG tablet Take 1 tablet (20 mg total) by mouth 3 (three) times daily. May refill 11/17/13  90 tablet  0  . ibuprofen (ADVIL,MOTRIN) 200 MG tablet Take 200 mg by mouth every 6 (six) hours as needed for mild pain.       No current facility-administered medications for this visit.   Objective: BP 100/68  Pulse 88  Temp(Src) 97.5 F (36.4 C)  Ht  (1.6 m)  Wt 113 lb (51.256 kg)  BMI 20.02 kg/m2 Gen: NAD, resting comfortably CV: RRR no murmurs rubs or gallops Lungs: CTAB no crackles, wheeze, rhonchi Ext: no edema Skin: warm, dry, no rash Neuro: grossly normal, moves all extremities Psych: normal mood, talkative  Assessment/Plan:  CIGARETTE  SMOKER Advised cessation. Patient not interested in quitting. Informed of COPD on problem list and this may have moved patient into the contemplative stage though diagnosis is not clear  ATTENTION DEFICIT DISORDER, ADULT Doing well. Refill Adderall 20 mg 3 times a day for 3 months. Followup at that time   Meds ordered this encounter  Medications  . DISCONTD: amphetamine-dextroamphetamine (ADDERALL) 20 MG tablet    Sig: Take 1 tablet (20 mg total) by mouth 3 (three) times daily.    Dispense:  90 tablet    Refill:  0  . DISCONTD: amphetamine-dextroamphetamine (ADDERALL) 20 MG tablet    Sig: Take 1 tablet (20 mg total) by mouth 3 (three) times daily. May refill 10/17/13    Dispense:  90 tablet    Refill:  0  . amphetamine-dextroamphetamine (ADDERALL) 20 MG tablet    Sig: Take 1 tablet (20 mg total) by mouth 3 (three) times daily. May refill 11/17/13    Dispense:  90 tablet    Refill:  0

## 2013-09-18 NOTE — Assessment & Plan Note (Signed)
Doing well. Refill Adderall 20 mg 3 times a day for 3 months. Followup at that time

## 2013-09-18 NOTE — Assessment & Plan Note (Signed)
Advised cessation. Patient not interested in quitting. Informed of COPD on problem list and this may have moved patient into the contemplative stage though diagnosis is not clear

## 2013-11-20 ENCOUNTER — Ambulatory Visit (INDEPENDENT_AMBULATORY_CARE_PROVIDER_SITE_OTHER): Payer: 59 | Admitting: Family Medicine

## 2013-11-20 ENCOUNTER — Encounter: Payer: Self-pay | Admitting: Family Medicine

## 2013-11-20 VITALS — BP 100/68 | HR 99 | Temp 98.1°F | Wt 112.0 lb

## 2013-11-20 DIAGNOSIS — B349 Viral infection, unspecified: Secondary | ICD-10-CM

## 2013-11-20 DIAGNOSIS — J329 Chronic sinusitis, unspecified: Secondary | ICD-10-CM

## 2013-11-20 DIAGNOSIS — B9789 Other viral agents as the cause of diseases classified elsewhere: Secondary | ICD-10-CM

## 2013-11-20 NOTE — Progress Notes (Signed)
  Tana ConchStephen Waunetta Riggle, MD Phone: [phone]  Subjective:   Hayley Miranda is a 33 y.o. year old very pleasant female patient who presents with the following:  Sinus pressure/congestoin -Symptoms started 1 week ago. Started allergra on Friday, sat, Sunday and seems to be less and less effective. Ears clogged, mioderate pain in forehead, equilibrium somewhat off. Sunday and Monday night with subjective fevers. Some chills. No improvement in symptoms. No other treatments tried. Uncle sick with similar symptoms.  ROS- No lightheadedness/dizziness/shortness of breat/chest pain   Past Medical History- smoker, ADD, allergies  Medications- reviewed and updated Current Outpatient Prescriptions  Medication Sig Dispense Refill  . amphetamine-dextroamphetamine (ADDERALL) 20 MG tablet Take 1 tablet (20 mg total) by mouth 3 (three) times daily. May refill 11/17/13 90 tablet 0  . ibuprofen (ADVIL,MOTRIN) 200 MG tablet Take 200 mg by mouth every 6 (six) hours as needed for mild pain.     No current facility-administered medications for this visit.    Objective: BP 100/68 mmHg  Pulse 99  Temp(Src) 98.1 F (36.7 C)  Wt 112 lb (50.803 kg) Gen: NAD, resting comfortably HEENT: nares erythematous and swollen with yellow discharge, oropharynx normal without pharyngeal exudate, TM normal bilaterally, Mucous membranes are moist. No lymphadenopathy.  Pain palpation of frontal sinuses bilaterally.  CV: RRR no murmurs rubs or gallops Lungs: CTAB no crackles, wheeze, rhonchi Abdomen: soft/nontender/nondistended/normal bowel sounds.  Ext: no edema Skin: warm, dry, no rash   Assessment/Plan:  Viral Sinusitis Advised of symptomatic care (see AVS).  Doubt influenza as afebrile I am concerned about progression to bacterial sinusitis. Plan will be call back if worsens before weekend or if persists into next week and call in azithromycin in that case.  Given reasons for return -new symptoms or persistence past  antibiotics Only 1 thing with decongestant-switch to sudafed plain.

## 2013-11-20 NOTE — Patient Instructions (Signed)
Take plain sudafed for next few days.  Try a neti pot twice a day.  Ibuprofen 400mg  twice a day for now.   Viral sinusitis for now but these are the indications for potential bacterial infection:  If symptoms worsen, call me and I will call in an antibiotic If symptoms persist to next Monday, I will call in an antibiotic as well.

## 2013-12-09 ENCOUNTER — Encounter: Payer: Self-pay | Admitting: Family Medicine

## 2013-12-09 ENCOUNTER — Ambulatory Visit: Payer: 59 | Admitting: Family Medicine

## 2013-12-09 ENCOUNTER — Ambulatory Visit (INDEPENDENT_AMBULATORY_CARE_PROVIDER_SITE_OTHER): Payer: 59 | Admitting: Family Medicine

## 2013-12-09 VITALS — BP 110/62 | Temp 98.4°F | Wt 116.0 lb

## 2013-12-09 DIAGNOSIS — F988 Other specified behavioral and emotional disorders with onset usually occurring in childhood and adolescence: Secondary | ICD-10-CM

## 2013-12-09 DIAGNOSIS — Z720 Tobacco use: Secondary | ICD-10-CM

## 2013-12-09 DIAGNOSIS — F17200 Nicotine dependence, unspecified, uncomplicated: Secondary | ICD-10-CM

## 2013-12-09 DIAGNOSIS — F909 Attention-deficit hyperactivity disorder, unspecified type: Secondary | ICD-10-CM

## 2013-12-09 MED ORDER — AMPHETAMINE-DEXTROAMPHETAMINE 20 MG PO TABS: 20.0000 mg | ORAL_TABLET | Freq: Three times a day (TID) | ORAL | Status: DC

## 2013-12-09 NOTE — Patient Instructions (Signed)
Glad things are going well. Continue current dose. Follow up in 3 months.

## 2013-12-09 NOTE — Progress Notes (Signed)
  Tana ConchStephen Hunter, MD Phone: [phone]  Subjective:   Hayley Miranda is a 33 y.o. year old very pleasant female patient who presents with the following:  Adhd-controlled Adderall continues to help with concentration at work and with social interactions with boyfriend at home. Continue Adderall 20mg  three times a day.  No side effects  ROS-no palpitations or unintentional weight loss  Tobacco abuse Now down to < 1 ppd. Not interested in quitting.  ROS-no chest pain or shortness of breath  Past Medical History- Patient Active Problem List   Diagnosis Date Noted  . COPD (chronic obstructive pulmonary disease) with chronic bronchitis 01/13/2011    Priority: High  . CIGARETTE SMOKER 03/03/2008    Priority: High  . Attention deficit disorder 02/01/2007    Priority: Medium  . ALLERGIC RHINITIS CAUSE UNSPECIFIED 03/03/2008    Priority: Low   Medications- reviewed and updated Current Outpatient Prescriptions  Medication Sig Dispense Refill  . amphetamine-dextroamphetamine (ADDERALL) 20 MG tablet Take 1 tablet (20 mg total) by mouth 3 (three) times daily. May refill 02/17/14 90 tablet 0  . ibuprofen (ADVIL,MOTRIN) 200 MG tablet Take 200 mg by mouth every 6 (six) hours as needed for mild pain.     No current facility-administered medications for this visit.   Objective: BP 110/62 mmHg  Temp(Src) 98.4 F (36.9 C)  Wt 116 lb (52.617 kg) Gen: NAD, resting comfortably CV: RRR no murmurs rubs or gallops Lungs: CTAB no crackles, wheeze, rhonchi Ext: no edema Skin: warm, dry, no rash Psych: normal mood, talkative  Assessment/Plan:  Attention deficit disorder Controlled. Continue adderall 20mg  3x a day for 3 months, given upper end dose, follow up in office 3 months.   CIGARETTE SMOKER Down to <1 PPD. Not interested in quitting but wants to keep cutting down. Encouraged cessation and advised 1800quit now if becomes ready or come visit.    Health Maintenance Due  Topic Date Due   . PAP SMEAR -going to GYN for annual exam today 08/29/1996    Meds ordered this encounter  Medications  . amphetamine-dextroamphetamine (ADDERALL) 20 MG tablet    Sig: Take 1 tablet (20 mg total) by mouth 3 (three) times daily. May refill 12/17/13    Dispense:  90 tablet    Refill:  0  .  amphetamine-dextroamphetamine (ADDERALL) 20 MG tablet    Sig: Take 1 tablet (20 mg total) by mouth 3 (three) times daily. May refill 01/17/14    Dispense:  90 tablet    Refill:  0  . amphetamine-dextroamphetamine (ADDERALL) 20 MG tablet    Sig: Take 1 tablet (20 mg total) by mouth 3 (three) times daily. May refill 02/17/14    Dispense:  90 tablet    Refill:  0

## 2013-12-09 NOTE — Assessment & Plan Note (Signed)
Down to <1 PPD. Not interested in quitting but wants to keep cutting down. Encouraged cessation and advised 1800quit now if becomes ready or come visit.

## 2013-12-09 NOTE — Assessment & Plan Note (Signed)
Controlled. Continue adderall 20mg  3x a day for 3 months, given upper end dose, follow up in office 3 months.

## 2014-02-14 ENCOUNTER — Ambulatory Visit: Payer: Self-pay | Admitting: Family Medicine

## 2014-03-11 ENCOUNTER — Encounter: Payer: Self-pay | Admitting: Family Medicine

## 2014-03-11 ENCOUNTER — Ambulatory Visit (INDEPENDENT_AMBULATORY_CARE_PROVIDER_SITE_OTHER): Payer: 59 | Admitting: Family Medicine

## 2014-03-11 VITALS — BP 110/70 | HR 86 | Temp 98.0°F | Wt 112.0 lb

## 2014-03-11 DIAGNOSIS — Z720 Tobacco use: Secondary | ICD-10-CM

## 2014-03-11 DIAGNOSIS — F909 Attention-deficit hyperactivity disorder, unspecified type: Secondary | ICD-10-CM

## 2014-03-11 DIAGNOSIS — F17200 Nicotine dependence, unspecified, uncomplicated: Secondary | ICD-10-CM

## 2014-03-11 DIAGNOSIS — F988 Other specified behavioral and emotional disorders with onset usually occurring in childhood and adolescence: Secondary | ICD-10-CM

## 2014-03-11 MED ORDER — AMPHETAMINE-DEXTROAMPHETAMINE 20 MG PO TABS: 20.0000 mg | ORAL_TABLET | Freq: Three times a day (TID) | ORAL | Status: DC

## 2014-03-11 NOTE — Patient Instructions (Addendum)
Call to make appointment to have your PAP SMEAR done  ADD-refilled for 3 months. May get 3 more months refill by phone and then see me in 6 months.   Let's update Labs  [name] will talk to you to see if we can work out the billing issues

## 2014-03-11 NOTE — Progress Notes (Signed)
  Tana ConchStephen Zadaya Cuadra, MD Phone: [phone]  Subjective:   Hayley Miranda is a 34 y.o. year old very pleasant female patient who presents with the following:  ADD follow up- stable/controlled -continues to do well on adderall 20mg  TID. Denies issues at work or in personal life. No side effects.  ROS- no unintentional weight loss, fatigue, palpitatoins  Past Medical History- Patient Active Problem List   Diagnosis Date Noted  . CIGARETTE SMOKER 03/03/2008    Priority: High  . Attention deficit disorder 02/01/2007    Priority: Medium  . COPD (chronic obstructive pulmonary disease) with chronic bronchitis 01/13/2011    Priority: Low  . ALLERGIC RHINITIS CAUSE UNSPECIFIED 03/03/2008    Priority: Low   Medications- reviewed and updated Current Outpatient Prescriptions  Medication Sig Dispense Refill  . amphetamine-dextroamphetamine (ADDERALL) 20 MG tablet Take 1 tablet (20 mg total) by mouth 3 (three) times daily. May refill 05/18/14 90 tablet 0  . ibuprofen (ADVIL,MOTRIN) 200 MG tablet Take 200 mg by mouth every 6 (six) hours as needed for mild pain.     No current facility-administered medications for this visit.    Objective: BP 110/70 mmHg  Pulse 86  Temp(Src) 98 F (36.7 C)  Wt 112 lb (50.803 kg) Gen: NAD, resting comfortably CV: RRR no murmurs rubs or gallops Lungs: CTAB no crackles, wheeze, rhonchi Ext: no edema Skin: warm, dry, no rash Neuro: grossly normal, moves all extremities   Assessment/Plan:  CIGARETTE SMOKER S/HPI: just under 1 PPD, not interested in quitting; ros-denies shortness of breath or chronic cough A/p: advised cessation, patient not interested but states will continue to cut back on her own.    Attention deficit disorder Refilled x 3 months adderall 20mg  TID. Refill by phone in 3 months, office in 6 months. Consider urine drug testing within next year.    6 month follow up  Meds ordered this encounter  Medications  . DISCONTD:  amphetamine-dextroamphetamine (ADDERALL) 20 MG tablet    Sig: Take 1 tablet (20 mg total) by mouth 3 (three) times daily. May refill 03/18/14    Dispense:  90 tablet    Refill:  0  . DISCONTD: amphetamine-dextroamphetamine (ADDERALL) 20 MG tablet    Sig: Take 1 tablet (20 mg total) by mouth 3 (three) times daily. May refill 04/18/14    Dispense:  90 tablet    Refill:  0  . amphetamine-dextroamphetamine (ADDERALL) 20 MG tablet    Sig: Take 1 tablet (20 mg total) by mouth 3 (three) times daily. May refill 05/18/14    Dispense:  90 tablet    Refill:  0

## 2014-03-11 NOTE — Assessment & Plan Note (Signed)
Refilled x 3 months adderall 20mg  TID. Refill by phone in 3 months, office in 6 months. Consider urine drug testing within next year.

## 2014-03-11 NOTE — Assessment & Plan Note (Addendum)
S/HPI: just under 1 PPD, not interested in quitting; ros-denies shortness of breath or chronic cough A/p: advised cessation, patient not interested but states will continue to cut back on her own.

## 2014-07-03 ENCOUNTER — Telehealth: Payer: Self-pay | Admitting: Family Medicine

## 2014-07-03 DIAGNOSIS — F988 Other specified behavioral and emotional disorders with onset usually occurring in childhood and adolescence: Secondary | ICD-10-CM

## 2014-07-03 MED ORDER — AMPHETAMINE-DEXTROAMPHETAMINE 20 MG PO TABS: 20.0000 mg | ORAL_TABLET | Freq: Three times a day (TID) | ORAL | Status: DC

## 2014-07-03 NOTE — Telephone Encounter (Signed)
Pt request refill amphetamine-dextroamphetamine (ADDERALL) 20 MG tablet 3 mo supply  Pt going out of town on Friday night

## 2014-07-03 NOTE — Telephone Encounter (Signed)
Refill ok? 

## 2014-07-03 NOTE — Telephone Encounter (Signed)
Yes thanks 

## 2014-07-03 NOTE — Telephone Encounter (Signed)
rx up front and ready for pt to pick up.

## 2014-09-10 ENCOUNTER — Encounter: Payer: Self-pay | Admitting: Family Medicine

## 2014-09-10 ENCOUNTER — Ambulatory Visit (INDEPENDENT_AMBULATORY_CARE_PROVIDER_SITE_OTHER): Payer: 59 | Admitting: Family Medicine

## 2014-09-10 VITALS — BP 100/60 | HR 75 | Temp 98.3°F | Wt 112.0 lb

## 2014-09-10 DIAGNOSIS — F17200 Nicotine dependence, unspecified, uncomplicated: Secondary | ICD-10-CM

## 2014-09-10 DIAGNOSIS — Z23 Encounter for immunization: Secondary | ICD-10-CM

## 2014-09-10 DIAGNOSIS — F909 Attention-deficit hyperactivity disorder, unspecified type: Secondary | ICD-10-CM

## 2014-09-10 DIAGNOSIS — Z720 Tobacco use: Secondary | ICD-10-CM

## 2014-09-10 DIAGNOSIS — F988 Other specified behavioral and emotional disorders with onset usually occurring in childhood and adolescence: Secondary | ICD-10-CM

## 2014-09-10 MED ORDER — AMPHETAMINE-DEXTROAMPHETAMINE 20 MG PO TABS: 20.0000 mg | ORAL_TABLET | Freq: Three times a day (TID) | ORAL | Status: DC

## 2014-09-10 NOTE — Patient Instructions (Addendum)
Received flu shot today.  Glad things are going well. Refilled 3 months, call in 3 months and see [place] in 6 months.   Please see your GYN and have them send [place] a copy of your pap smear

## 2014-09-10 NOTE — Assessment & Plan Note (Signed)
S: slowly cutting back. Now 2-5 per day and not smoking after 6:30 PM. Asymptomtaic A/P: advised cessation, patient is trying to cut back on her own

## 2014-09-10 NOTE — Assessment & Plan Note (Signed)
S: controlled. Tolerating Adderall  TID A/P:Continue current meds. Call in 3 months for refill. In person 6 months. Controlled substance contract signed. Consider UDS follow up- suspect low risk

## 2014-09-10 NOTE — Progress Notes (Signed)
Hayley Conch, MD  Subjective:  Hayley Miranda is a 34 y.o. year old very pleasant female patient who presents for/with See problem oriented charting ROS- no chest pain or shortness of breath, no unintentional weight loss or palpitations  Past Medical History-  Patient Active Problem List   Diagnosis Date Noted  . CIGARETTE SMOKER 03/03/2008    Priority: High  . Attention deficit disorder 02/01/2007    Priority: Medium  . COPD (chronic obstructive pulmonary disease) with chronic bronchitis 01/13/2011    Priority: Low  . ALLERGIC RHINITIS CAUSE UNSPECIFIED 03/03/2008    Priority: Low    Medications- reviewed and updated Current Outpatient Prescriptions  Medication Sig Dispense Refill  . amphetamine-dextroamphetamine (ADDERALL) 20 MG tablet Take 1 tablet (20 mg total) by mouth 3 (three) times daily. May refill 09/10/14 90 tablet 0  . ibuprofen (ADVIL,MOTRIN) 200 MG tablet Take 200 mg by mouth every 6 (six) hours as needed for mild pain.     No current facility-administered medications for this visit.    Objective: BP 100/60 mmHg  Pulse 75  Temp(Src) 98.3 F (36.8 C)  Wt 112 lb (50.803 kg) Gen: NAD, resting comfortably Mucous membranes are moist. CV: RRR no murmurs rubs or gallops Lungs: CTAB no crackles, wheeze, rhonchi Abdomen: soft/nontender/nondistended/normal bowel sounds.  Ext: no edema Skin: warm, dry  Assessment/Plan:  Attention deficit disorder S: controlled. Tolerating Adderall  TID A/P:Continue current meds. Call in 3 months for refill. In person 6 months. Controlled substance contract signed. Consider UDS follow up- suspect low risk  CIGARETTE SMOKER S: slowly cutting back. Now 2-5 per day and not smoking after 6:30 PM. Asymptomtaic A/P: advised cessation, patient is trying to cut back on her own   6 months. Sees GYN- advised to return to them for pap and have them send [place] a copy  Orders Placed This Encounter  Procedures  . Flu Vaccine QUAD 36+ mos  IM    Meds ordered this encounter  Medications  . DELETED as duplicated year : amphetamine-dextroamphetamine (ADDERALL) 20 MG tablet    Sig: Take 1 tablet (20 mg total) by mouth 3 (three) times daily. May refill 09/10/14/16    Dispense:  90 tablet    Refill:  0  . : amphetamine-dextroamphetamine (ADDERALL) 20 MG tablet    Sig: Take 1 tablet (20 mg total) by mouth 3 (three) times daily. May refill 10/10/14    Dispense:  90 tablet    Refill:  0  . : amphetamine-dextroamphetamine (ADDERALL) 20 MG tablet    Sig: Take 1 tablet (20 mg total) by mouth 3 (three) times daily. May refill 11/10/14    Dispense:  90 tablet    Refill:  0  . amphetamine-dextroamphetamine (ADDERALL) 20 MG tablet    Sig: Take 1 tablet (20 mg total) by mouth 3 (three) times daily. May refill 09/10/14    Dispense:  90 tablet    Refill:  0

## 2014-12-30 ENCOUNTER — Telehealth: Payer: Self-pay | Admitting: Family Medicine

## 2014-12-30 ENCOUNTER — Encounter: Payer: Self-pay | Admitting: Adult Health

## 2014-12-30 ENCOUNTER — Ambulatory Visit (INDEPENDENT_AMBULATORY_CARE_PROVIDER_SITE_OTHER): Payer: 59 | Admitting: Adult Health

## 2014-12-30 VITALS — BP 104/70 | HR 108 | Temp 98.0°F | Ht 63.0 in | Wt 112.2 lb

## 2014-12-30 DIAGNOSIS — J0101 Acute recurrent maxillary sinusitis: Secondary | ICD-10-CM | POA: Diagnosis not present

## 2014-12-30 MED ORDER — DOXYCYCLINE HYCLATE 100 MG PO CAPS: 100.0000 mg | ORAL_CAPSULE | Freq: Two times a day (BID) | ORAL | Status: DC

## 2014-12-30 NOTE — Patient Instructions (Addendum)
It was great meeting you today  Your exam is consistent with a bacterial sinus infection.   I have sent in a prescription for doxycyline, take this twice a day for 7 days.   Use a normal saline nasal spray   Follow up if no improvement in the next 2-3 days.    Sinusitis, Adult Sinusitis is redness, soreness, and inflammation of the paranasal sinuses. Paranasal sinuses are air pockets within the bones of your face. They are located beneath your eyes, in the middle of your forehead, and above your eyes. In healthy paranasal sinuses, mucus is able to drain out, and air is able to circulate through them by way of your nose. However, when your paranasal sinuses are inflamed, mucus and air can become trapped. This can allow bacteria and other germs to grow and cause infection. Sinusitis can develop quickly and last only a short time (acute) or continue over a long period (chronic). Sinusitis that lasts for more than 12 weeks is considered chronic. CAUSES Causes of sinusitis include:  Allergies.  Structural abnormalities, such as displacement of the cartilage that separates your nostrils (deviated septum), which can decrease the air flow through your nose and sinuses and affect sinus drainage.  Functional abnormalities, such as when the small hairs (cilia) that line your sinuses and help remove mucus do not work properly or are not present. SIGNS AND SYMPTOMS Symptoms of acute and chronic sinusitis are the same. The primary symptoms are pain and pressure around the affected sinuses. Other symptoms include:  Upper toothache.  Earache.  Headache.  Bad breath.  Decreased sense of smell and taste.  A cough, which worsens when you are lying flat.  Fatigue.  Fever.  Thick drainage from your nose, which often is green and may contain pus (purulent).  Swelling and warmth over the affected sinuses. DIAGNOSIS Your health care provider will perform a physical exam. During your exam, your  health care provider may perform any of the following to help determine if you have acute sinusitis or chronic sinusitis:  Look in your nose for signs of abnormal growths in your nostrils (nasal polyps).  Tap over the affected sinus to check for signs of infection.  View the inside of your sinuses using an imaging device that has a light attached (endoscope). If your health care provider suspects that you have chronic sinusitis, one or more of the following tests may be recommended:  Allergy tests.  Nasal culture. A sample of mucus is taken from your nose, sent to a lab, and screened for bacteria.  Nasal cytology. A sample of mucus is taken from your nose and examined by your health care provider to determine if your sinusitis is related to an allergy. TREATMENT Most cases of acute sinusitis are related to a viral infection and will resolve on their own within 10 days. Sometimes, medicines are prescribed to help relieve symptoms of both acute and chronic sinusitis. These may include pain medicines, decongestants, nasal steroid sprays, or saline sprays. However, for sinusitis related to a bacterial infection, your health care provider will prescribe antibiotic medicines. These are medicines that will help kill the bacteria causing the infection. Rarely, sinusitis is caused by a fungal infection. In these cases, your health care provider will prescribe antifungal medicine. For some cases of chronic sinusitis, surgery is needed. Generally, these are cases in which sinusitis recurs more than 3 times per year, despite other treatments. HOME CARE INSTRUCTIONS  Drink plenty of water. Water helps thin the  mucus so your sinuses can drain more easily.  Use a humidifier.  Inhale steam 3-4 times a day (for example, sit in the bathroom with the shower running).  Apply a warm, moist washcloth to your face 3-4 times a day, or as directed by your health care provider.  Use saline nasal sprays to help  moisten and clean your sinuses.  Take medicines only as directed by your health care provider.  If you were prescribed either an antibiotic or antifungal medicine, finish it all even if you start to feel better. SEEK IMMEDIATE MEDICAL CARE IF:  You have increasing pain or severe headaches.  You have nausea, vomiting, or drowsiness.  You have swelling around your face.  You have vision problems.  You have a stiff neck.  You have difficulty breathing.   This information is not intended to replace advice given to you by your health care provider. Make sure you discuss any questions you have with your health care provider.   Document Released: 12/20/2004 Document Revised: 01/10/2014 Document Reviewed: 01/04/2011 Elsevier Interactive Patient Education [employer]

## 2014-12-30 NOTE — Telephone Encounter (Signed)
Ok for Dr. Caryl NeverBurchette to sign?

## 2014-12-30 NOTE — Telephone Encounter (Signed)
Patient requesting refill on amphetamine-dextroamphetamine (ADDERALL) 20 MG tablet

## 2014-12-30 NOTE — Progress Notes (Signed)
   Subjective:    Patient ID: Hayley Miranda, female    DOB: 1980-01-20, 34 y.o.   MRN: [id number]  HPI  34 year old female who presents to the office today for two weeks of sinus pain and pressure, ear pain, rhinorrhea, PND and nose bleeds (2). She continues to smoke half a pack of day.   She has been using Allegra D with minimal relief.   Review of Systems  Constitutional: Negative.   HENT: Positive for congestion, postnasal drip, rhinorrhea and sinus pressure (frontal and maxillary). Negative for ear discharge, ear pain, sore throat and trouble swallowing.   Neurological: Positive for headaches. Negative for dizziness, weakness and light-headedness.  All other systems reviewed and are negative.      Objective:   Physical Exam  Constitutional: She is oriented to person, place, and time. She appears well-developed and well-nourished. No distress.  HENT:  Head: Normocephalic and atraumatic.  Right Ear: External ear normal.  Left Ear: External ear normal.  Nose: Nose normal.  Mouth/Throat: Oropharynx is clear and moist. No oropharyngeal exudate.  No signs of epistaxis in either nare   Eyes: Conjunctivae and EOM are normal. Pupils are equal, round, and reactive to light. Right eye exhibits no discharge. Left eye exhibits no discharge. No scleral icterus.  Neck: Normal range of motion. Neck supple.  Cardiovascular: Normal rate, regular rhythm, normal heart sounds and intact distal pulses.  Exam reveals no gallop and no friction rub.   No murmur heard. Pulmonary/Chest: Effort normal and breath sounds normal. No respiratory distress. She has no wheezes. She has no rales. She exhibits no tenderness.  Lymphadenopathy:    She has cervical adenopathy.  Neurological: She is alert and oriented to person, place, and time.  Skin: Skin is warm and dry. No rash noted. She is not diaphoretic. No erythema. No pallor.  Psychiatric: She has a normal mood and affect. Her behavior is normal. Judgment  and thought content normal.  Nursing note and vitals reviewed.      Assessment & Plan:  1. Acute recurrent maxillary sinusitis - doxycycline (VIBRAMYCIN) 100 MG capsule; Take 1 capsule (100 mg total) by mouth 2 (two) times daily.  Dispense: 14 capsule; Refill: 0 - Normal saline spray as needed

## 2014-12-31 ENCOUNTER — Other Ambulatory Visit: Payer: Self-pay

## 2014-12-31 DIAGNOSIS — F988 Other specified behavioral and emotional disorders with onset usually occurring in childhood and adolescence: Secondary | ICD-10-CM

## 2014-12-31 MED ORDER — AMPHETAMINE-DEXTROAMPHETAMINE 20 MG PO TABS: 20.0000 mg | ORAL_TABLET | Freq: Three times a day (TID) | ORAL | Status: DC

## 2014-12-31 NOTE — Telephone Encounter (Signed)
Lm on pt vm to let her know Rx is upfront for pick up for one month supply and Dr. Durene CalHunter will sign the rest when he returns.

## 2014-12-31 NOTE — Telephone Encounter (Signed)
Rx printed for Dr. B to sign.

## 2014-12-31 NOTE — Telephone Encounter (Signed)
Just ask him to sign one- I will sign rest when I get back

## 2015-02-03 ENCOUNTER — Other Ambulatory Visit: Payer: Self-pay

## 2015-02-03 ENCOUNTER — Telehealth: Payer: Self-pay | Admitting: Family Medicine

## 2015-02-03 DIAGNOSIS — F988 Other specified behavioral and emotional disorders with onset usually occurring in childhood and adolescence: Secondary | ICD-10-CM

## 2015-02-03 MED ORDER — AMPHETAMINE-DEXTROAMPHETAMINE 20 MG PO TABS: 20.0000 mg | ORAL_TABLET | Freq: Three times a day (TID) | ORAL | Status: DC

## 2015-02-03 NOTE — Telephone Encounter (Signed)
Notified pt that Rx upfront ready for pick up.

## 2015-02-03 NOTE — Telephone Encounter (Signed)
Pt request refill of the following: amphetamine-dextroamphetamine (ADDERALL) 20 MG tablet ° ° °Phamacy: ° °

## 2015-02-09 IMAGING — CR DG SHOULDER 2+V*R*
4 series · 4 of 4 positions shown · non-contrast
Comparison: None.

CLINICAL DATA: Right shoulder pain secondary to a fall.

EXAM:
RIGHT SHOULDER - 2+ VIEW

[w shoulder external right]
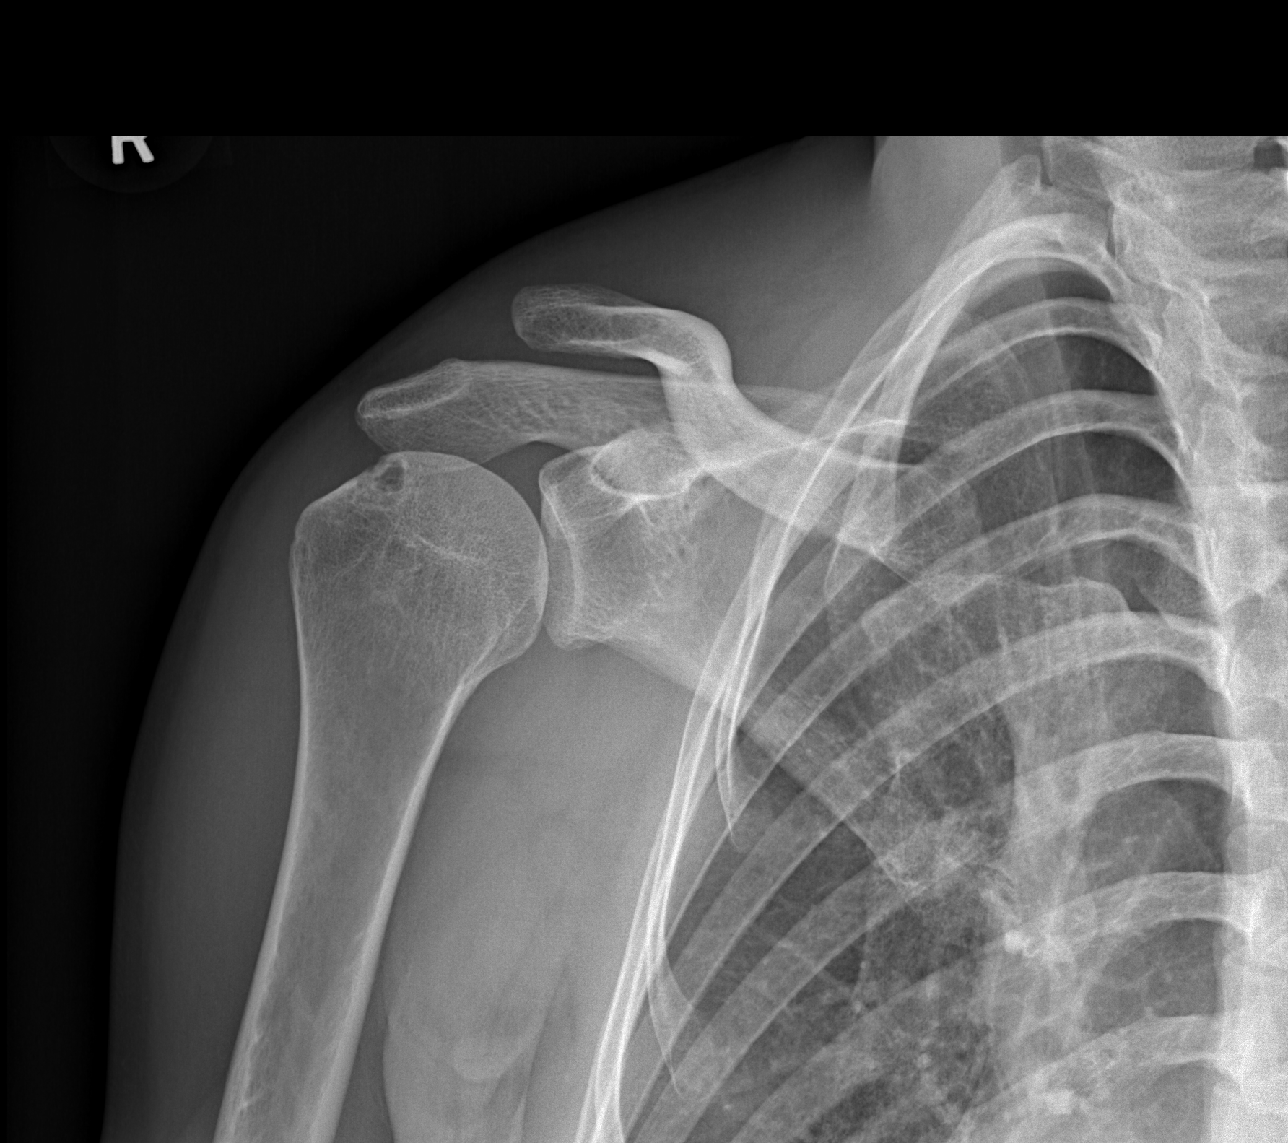

[w shoulder y-view right (1 of 2)]
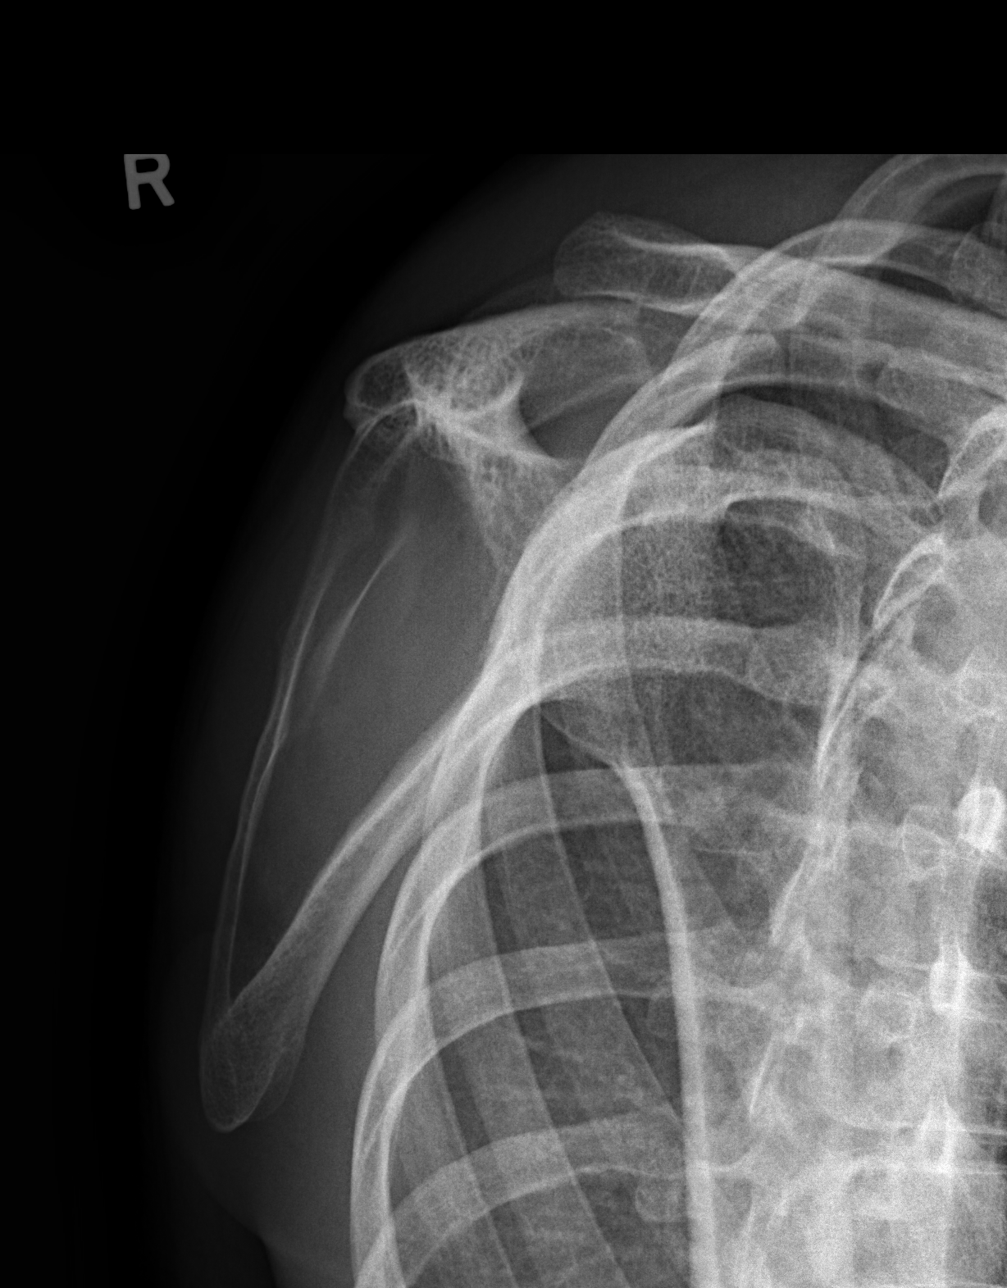

[w shoulder y-view right (2 of 2)]
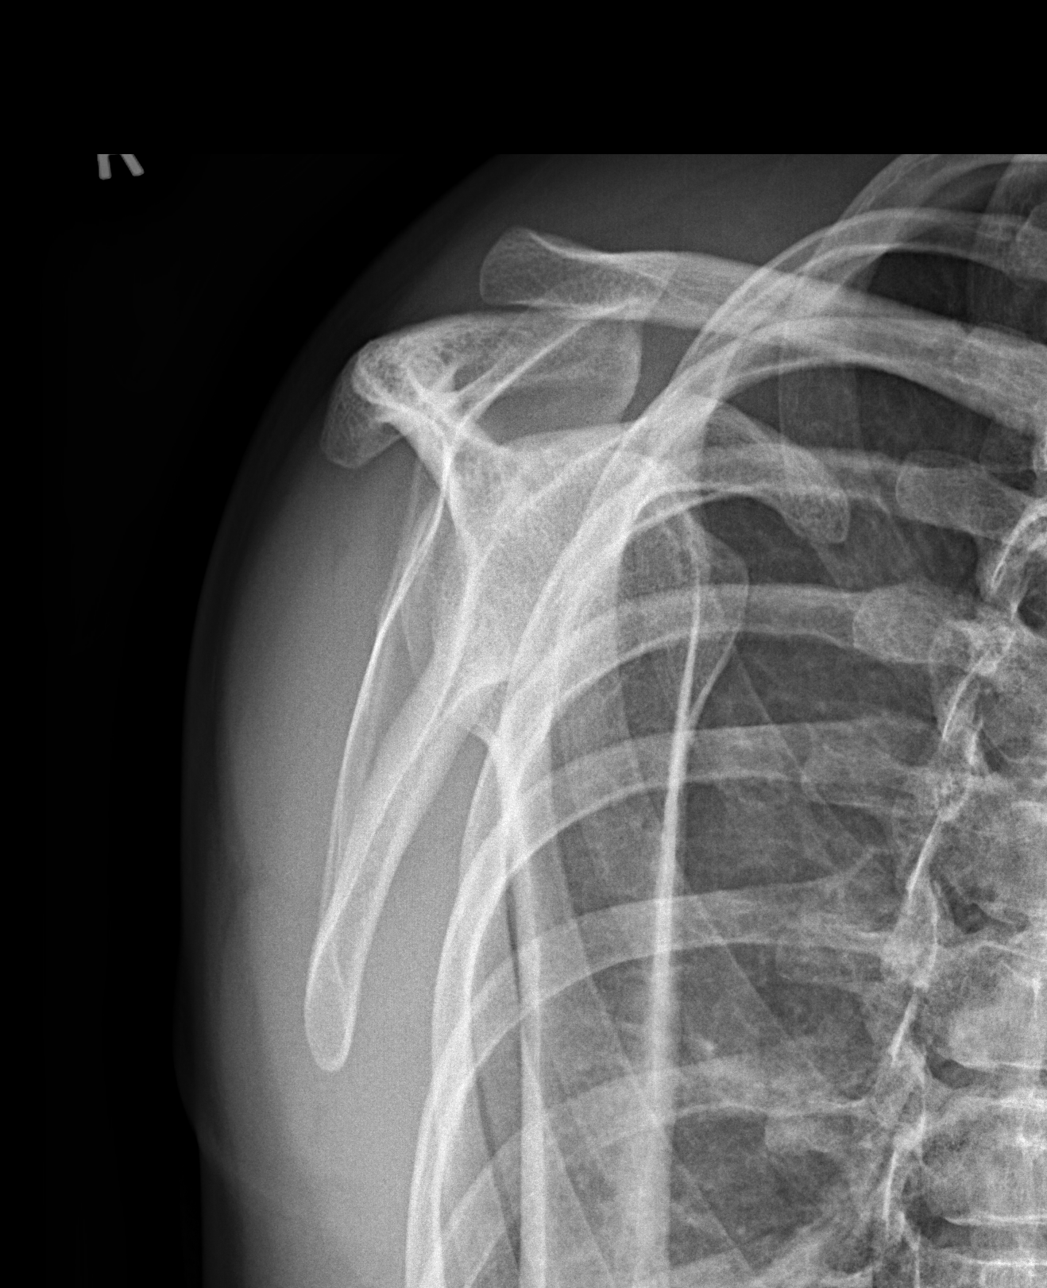

[x shoulder axillary right]
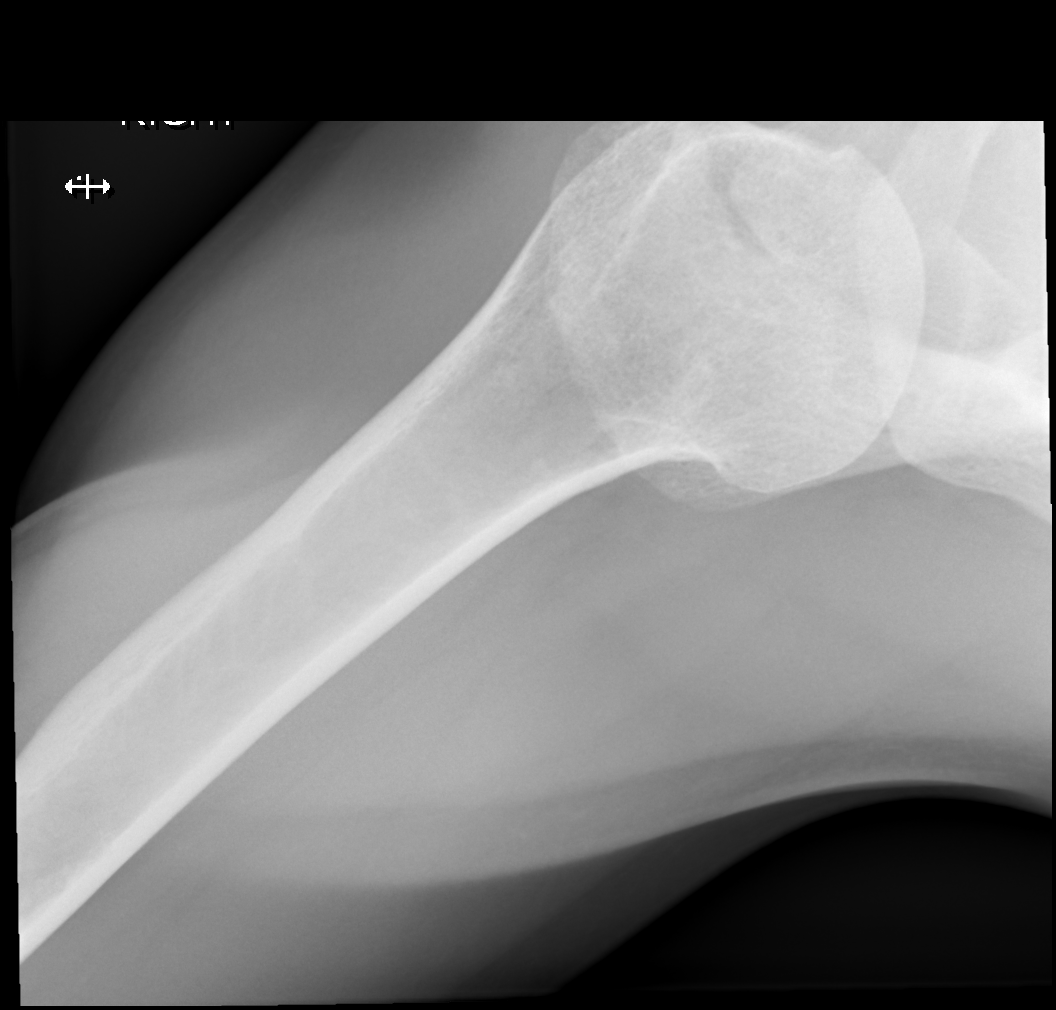

[4 of 4 positions shown; findings below may reference images not displayed]

FINDINGS: The glenohumeral joint appears normal. There are cystic degenerative
changes in the greater tuberosity of the proximal humerus. The
distal clavicle appears subluxed superiorly with widening of the
acromioclavicular joint. The possibility of AC joint separation
should be considered.
IMPRESSION: Possible AC joint separation.

## 2015-03-12 ENCOUNTER — Encounter: Payer: Self-pay | Admitting: Family Medicine

## 2015-03-12 ENCOUNTER — Ambulatory Visit (INDEPENDENT_AMBULATORY_CARE_PROVIDER_SITE_OTHER): Payer: 59 | Admitting: Family Medicine

## 2015-03-12 VITALS — BP 112/70 | HR 95 | Temp 98.5°F | Wt 109.0 lb

## 2015-03-12 DIAGNOSIS — B351 Tinea unguium: Secondary | ICD-10-CM

## 2015-03-12 DIAGNOSIS — F988 Other specified behavioral and emotional disorders with onset usually occurring in childhood and adolescence: Secondary | ICD-10-CM

## 2015-03-12 DIAGNOSIS — F17200 Nicotine dependence, unspecified, uncomplicated: Secondary | ICD-10-CM

## 2015-03-12 DIAGNOSIS — F909 Attention-deficit hyperactivity disorder, unspecified type: Secondary | ICD-10-CM | POA: Diagnosis not present

## 2015-03-12 DIAGNOSIS — Z79899 Other long term (current) drug therapy: Secondary | ICD-10-CM | POA: Diagnosis not present

## 2015-03-12 MED ORDER — AMPHETAMINE-DEXTROAMPHETAMINE 20 MG PO TABS: 20.0000 mg | ORAL_TABLET | Freq: Three times a day (TID) | ORAL | Status: DC

## 2015-03-12 MED ORDER — TERBINAFINE HCL 250 MG PO TABS: 250.0000 mg | ORAL_TABLET | Freq: Every day | ORAL | Status: DC

## 2015-03-12 NOTE — Assessment & Plan Note (Signed)
S: controlled on  Adderall 20mg  TID. On weekends when not working does tend to take less A/P: refilled for 3 months. Call in 3 months for refill. In person 7 months. Plan UDS follow up

## 2015-03-12 NOTE — Assessment & Plan Note (Signed)
S: Right great toenail got damaged at work a few years ago. Grew back but now thickening and yellowing more.  O: hypertrophied, yellow nail A/P: strong likelihood onychomycosis- has had before- doesn't air feet out much. Responded to lamisil 3 months- we will retrial with LFTs in 6 weeks

## 2015-03-12 NOTE — Progress Notes (Signed)
Tana ConchStephen Hunter, MD  Subjective:  Hayley Miranda is a 35 y.o. year old very pleasant female patient who presents for/with See problem oriented charting ROS- worsened attention on weekend when not on full dose adderall. No chest pain or shortness of breath. No abdominal pain or weight loss.   Past Medical History-  Patient Active Problem List   Diagnosis Date Noted  . CIGARETTE SMOKER 03/03/2008    Priority: High  . Attention deficit disorder 02/01/2007    Priority: Medium  . Onychomycosis 03/12/2015    Priority: Low  . 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 ALLERGIC RHINITIS CAUSE UNSPECIFIED 03/03/2008    Priority: Low    Medications- reviewed and updated Current Outpatient Prescriptions  Medication Sig Dispense Refill  . amphetamine-dextroamphetamine (ADDERALL) 20 MG tablet Take 1 tablet (20 mg total) by mouth 3 (three) times daily. May refill 4/31/17 90 tablet 0  . amphetamine-dextroamphetamine (ADDERALL) 20 MG tablet Take 1 tablet (20 mg total) by mouth 3 (three) times daily. May refill 06/03/15 90 tablet 0  . amphetamine-dextroamphetamine (ADDERALL) 20 MG tablet Take 1 tablet (20 mg total) by mouth 3 (three) times daily. May refill 6/31/17 90 tablet 0  . ibuprofen (ADVIL,MOTRIN) 200 MG tablet Take 200 mg by mouth every 6 (six) hours as needed for mild pain.    Marland Kitchen. terbinafine (LAMISIL) 250 MG tablet Take 1 tablet (250 mg total) by mouth daily. 90 tablet 0   No current facility-administered medications for this visit.    Objective: BP 112/70 mmHg  Pulse 95  Temp(Src) 98.5 F (36.9 C)  Wt 109 lb (49.442 kg) Gen: NAD, resting comfortably CV: RRR no murmurs rubs or gallops Lungs: CTAB no crackles, wheeze, rhonchi Abdomen: soft/nontender/nondistended/normal bowel sounds.  Ext: no edema Skin: warm, dry, no rash Neuro: grossly normal, moves all extremities  Also see objective  below  Assessment/Plan:  Onychomycosis S: Right great toenail got damaged at work a few years ago. Grew back but now thickening and yellowing more.  O: hypertrophied, yellow nail A/P: strong likelihood onychomycosis- has had before- doesn't air feet out much. Responded to lamisil 3 months- we will retrial with LFTs in 6 weeks   Attention deficit disorder S: controlled on  Adderall 20mg  TID. On weekends when not working does tend to take less A/P: refilled for 3 months. Call in 3 months for refill. In person 7 months. Plan UDS follow up   CIGARETTE SMOKER At 3 a day- strongly encouraged taking final step to quit completely. She is not quite ready   Return in about 7 months (around 10/12/2015). Return precautions advised.   Orders Placed This Encounter  Procedures  . Hepatic Function Panel    Standing Status: Future     Number of Occurrences:      Standing Expiration Date: 03/11/2016    Meds ordered this encounter  Medications  . amphetamine-dextroamphetamine (ADDERALL) 20 MG tablet    Sig: Take 1 tablet (20 mg total) by mouth 3 (three) times daily. May refill 4/31/17    Dispense:  90 tablet    Refill:  0  . amphetamine-dextroamphetamine (ADDERALL) 20 MG tablet    Sig: Take 1 tablet (20 mg total) by mouth 3 (three) times daily. May refill 06/03/15    Dispense:  90 tablet    Refill:  0  . amphetamine-dextroamphetamine (ADDERALL) 20 MG tablet    Sig: Take 1 tablet (20 mg total) by mouth 3 (three)  times daily. May refill 6/31/17    Dispense:  90 tablet    Refill:  0  . terbinafine (LAMISIL) 250 MG tablet    Sig: Take 1 tablet (250 mg total) by mouth daily.    Dispense:  90 tablet    Refill:  0

## 2015-03-12 NOTE — Assessment & Plan Note (Signed)
At 3 a day- strongly encouraged taking final step to quit completely. She is not quite ready

## 2015-03-12 NOTE — Patient Instructions (Addendum)
Glad things are going well. Refilled 3 months, call in 3 months and see us in 7 months.   Please see your GYN and have them send us a copy of your pap smear  Treat toenail fungus with 3 months of medication.  Schedule lab visits in 6 weeks to make sure liver ok on medicine  Trial vaseline at night for dry skin

## 2015-03-18 ENCOUNTER — Ambulatory Visit: Payer: Self-pay | Admitting: Obstetrics and Gynecology

## 2015-03-26 ENCOUNTER — Ambulatory Visit (INDEPENDENT_AMBULATORY_CARE_PROVIDER_SITE_OTHER): Payer: 59 | Admitting: Obstetrics and Gynecology

## 2015-03-26 ENCOUNTER — Encounter: Payer: Self-pay | Admitting: Obstetrics and Gynecology

## 2015-03-26 VITALS — BP 100/60 | HR 64 | Resp 15 | Ht 63.5 in | Wt 112.0 lb

## 2015-03-26 DIAGNOSIS — N920 Excessive and frequent menstruation with regular cycle: Secondary | ICD-10-CM | POA: Diagnosis not present

## 2015-03-26 DIAGNOSIS — Z113 Encounter for screening for infections with a predominantly sexual mode of transmission: Secondary | ICD-10-CM

## 2015-03-26 DIAGNOSIS — Z0000 Encounter for general adult medical examination without abnormal findings: Secondary | ICD-10-CM | POA: Diagnosis not present

## 2015-03-26 DIAGNOSIS — Z01419 Encounter for gynecological examination (general) (routine) without abnormal findings: Secondary | ICD-10-CM | POA: Diagnosis not present

## 2015-03-26 DIAGNOSIS — N63 Unspecified lump in unspecified breast: Secondary | ICD-10-CM

## 2015-03-26 DIAGNOSIS — Z124 Encounter for screening for malignant neoplasm of cervix: Secondary | ICD-10-CM | POA: Diagnosis not present

## 2015-03-26 DIAGNOSIS — N898 Other specified noninflammatory disorders of vagina: Secondary | ICD-10-CM

## 2015-03-26 LAB — LIPID PANEL
CHOLESTEROL: 161 mg/dL (ref 125–200)
HDL: 69 mg/dL (ref >=46)
LDL CALC: 73 mg/dL (ref <130)
Total CHOL/HDL Ratio: 2.3 Ratio (ref <=5.0)
Triglycerides: 95 mg/dL (ref <150)
VLDL: 19 mg/dL (ref <30)

## 2015-03-26 LAB — CBC
HCT: 41.6 % (ref 36.0–46.0)
Hemoglobin: 13.9 g/dL (ref 12.0–15.0)
MCH: 30.5 pg (ref 26.0–34.0)
MCHC: 33.4 g/dL (ref 30.0–36.0)
MCV: 91.4 fL (ref 78.0–100.0)
MPV: 9.6 fL (ref 8.6–12.4)
PLATELETS: 377 10*3/uL (ref 150–400)
RBC: 4.55 MIL/uL (ref 3.87–5.11)
RDW: 12.5 % (ref 11.5–15.5)
WBC: 4.6 10*3/uL (ref 4.0–10.5)

## 2015-03-26 LAB — COMPREHENSIVE METABOLIC PANEL
ALT: 18 U/L (ref 6–29)
AST: 22 U/L (ref 10–30)
Albumin: 4.6 g/dL (ref 3.6–5.1)
Alkaline Phosphatase: 60 U/L (ref 33–115)
BUN: 11 mg/dL (ref 7–25)
CALCIUM: 9.6 mg/dL (ref 8.6–10.2)
CO2: 28 mmol/L (ref 20–31)
Chloride: 102 mmol/L (ref 98–110)
Creat: 0.71 mg/dL (ref 0.50–1.10)
Glucose, Bld: 77 mg/dL (ref 65–99)
POTASSIUM: 4.7 mmol/L (ref 3.5–5.3)
Sodium: 138 mmol/L (ref 135–146)
TOTAL PROTEIN: 7.4 g/dL (ref 6.1–8.1)
Total Bilirubin: 0.6 mg/dL (ref 0.2–1.2)

## 2015-03-26 LAB — TSH: TSH: 3.22 mIU/L

## 2015-03-26 NOTE — Progress Notes (Signed)
Patient ID: Hayley Miranda, female   DOB: 02-09-1980, 35 y.o.   MRN: [id number] 35 y.o. G1P1001 SingleCaucasianF here for annual exam. Patient c/o vaginal discharge for a couple of days. The D/C is white, creamy. No vaginal irritation, burning or pruritus.  Period Cycle (Days): 28 Period Duration (Days): 5-6 days  Period Pattern: Regular Menstrual Flow: Moderate, Heavy Menstrual Control: Tampon Dysmenorrhea: (!) Moderate Dysmenorrhea Symptoms: Cramping  Saturates a super tampon in 2 hours. No BTB. Advil helps her cramps. Not currently sexually active, has been since her last visit.  Patient's last menstrual period was 03/16/2015.          Sexually active: Not currently   The current method of family planning is none.    Exercising: Yes.    weight training/ cardio  Smoker:  yes  Health Maintenance: Pap:  01/19/2011 ASCUS  History of abnormal Pap:  Yes several abnormal PAPs HX CIN1; colpo BX 2010  MMG:  Never Colonoscopy:  Never BMD:   Never TDaP:  2009 Gardasil: N/A   reports that she has been smoking Cigarettes.  She has been smoking about 0.50 packs per day. She has never used smokeless tobacco. She reports that she drinks about 3.6 oz of alcohol per week. She reports that she does not use illicit drugs. Daughter is 9. Works in a [employer]. Down to 1/2 a pack 1 x a week. Drinks about 6 drinks a week.   Past Medical History  Diagnosis Date  . Blood transfusion   . CELLULITIS, FOOT, LEFT 09/16/2008  . Dermatophytosis of nail 02/01/2007        . Blood transfusion without reported diagnosis   . Abnormal Pap smear of cervix   . STD (sexually transmitted disease) 06/2009    trichamonus    Past Surgical History  Procedure Laterality Date  . Foot surgery Right 2010  . Colposcopy      Current Outpatient Prescriptions  Medication Sig Dispense Refill  . amphetamine-dextroamphetamine (ADDERALL) 20 MG tablet Take 1 tablet (20 mg total) by mouth 3 (three) times daily. May refill  4/31/17 90 tablet 0  . amphetamine-dextroamphetamine (ADDERALL) 20 MG tablet Take 1 tablet (20 mg total) by mouth 3 (three) times daily. May refill 06/03/15 90 tablet 0  . amphetamine-dextroamphetamine (ADDERALL) 20 MG tablet Take 1 tablet (20 mg total) by mouth 3 (three) times daily. May refill 6/31/17 90 tablet 0  . ibuprofen (ADVIL,MOTRIN) 200 MG tablet Take 200 mg by mouth every 6 (six) hours as needed for mild pain.    Marland Kitchen. terbinafine (LAMISIL) 250 MG tablet Take 1 tablet (250 mg total) by mouth daily. 90 tablet 0   No current facility-administered medications for this visit.    Family History  Problem Relation Age of Onset  . Hyperlipidemia Father     fhx  . Hypertension Father     fhx  . Cancer      prostate/fhx  . Osteoporosis Mother   . Lung cancer Maternal Grandmother   . Lung cancer Paternal Grandmother   . Lung cancer Paternal Grandfather   . Liver cancer Maternal Grandfather     Review of Systems  Constitutional: Negative.   HENT: Negative.   Eyes: Negative.   Respiratory: Negative.   Cardiovascular: Negative.   Gastrointestinal: Negative.   Endocrine: Negative.   Genitourinary: Positive for vaginal discharge.  Musculoskeletal: Negative.   Skin: Negative.   Allergic/Immunologic: Negative.   Neurological: Negative.   Psychiatric/Behavioral: Negative.     Exam:  BP 100/60 mmHg  Pulse 64  Resp 15  Ht 5' 3.5" (1.613 m)  Wt 112 lb (50.803 kg)  BMI 19.53 kg/m2  LMP 03/16/2015  Weight change: @ Height:   Height: 5' 3.5" (161.3 cm)  Ht Readings from Last 3 Encounters:  03/26/15 5' 3.5" (1.613 m)  12/30/14  (1.6 m)  09/18/13  (1.6 m)    General appearance: alert, cooperative and appears stated age Head: Normocephalic, without obvious abnormality, atraumatic Neck: no adenopathy, supple, symmetrical, trachea midline and thyroid normal to inspection and palpation Lungs: clear to auscultation bilaterally Breasts: in the left breast at 7  o'clock, 1 cm from the areolar region is a pea sized, smooth, mobile lump. Heart: regular rate and rhythm Abdomen: soft, non-tender; bowel sounds normal; no masses,  no organomegaly Extremities: extremities normal, atraumatic, no cyanosis or edema Skin: Skin color, texture, turgor normal. No rashes or lesions Lymph nodes: Cervical, supraclavicular, and axillary nodes normal. No abnormal inguinal nodes palpated Neurologic: Grossly normal   Pelvic: External genitalia:  no lesions              Urethra:  normal appearing urethra with no masses, tenderness or lesions              Bartholins and Skenes: normal                 Vagina: normal appearing vagina with normal color and a slight increase in watery, white vaginal discharge.              Cervix: no lesions               Bimanual Exam:  Uterus:  normal size, contour, position, consistency, mobility, non-tender              Adnexa: no mass, fullness, tenderness               Rectovaginal: Confirms               Anus:  normal sphincter tone, no lesions  Chaperone was present for exam.  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

## 2015-03-26 NOTE — Progress Notes (Signed)
Scheduled patient while in office for bilateral diagnostic mammogram [facility] 04[facility]01[facility]2015 at 1:20 pm. She is agreeable to date and time. Placed in mammogram hold.

## 2015-03-27 LAB — STD PANEL
HIV: NONREACTIVE
Hepatitis B Surface Ag: NEGATIVE

## 2015-03-27 LAB — WET PREP BY MOLECULAR PROBE
CANDIDA SPECIES: NEGATIVE
Gardnerella vaginalis: POSITIVE — AB
Trichomonas vaginosis: NEGATIVE

## 2015-03-27 LAB — HEPATITIS C ANTIBODY: HCV Ab: NEGATIVE

## 2015-03-27 LAB — VITAMIN D 25 HYDROXY (VIT D DEFICIENCY, FRACTURES): Vit D, 25-Hydroxy: 32 ng/mL (ref 30–100)

## 2015-03-30 ENCOUNTER — Telehealth: Payer: Self-pay

## 2015-03-30 LAB — IPS N GONORRHOEA AND CHLAMYDIA BY PCR

## 2015-03-30 LAB — IPS PAP TEST WITH HPV

## 2015-03-30 MED ORDER — METRONIDAZOLE 0.75 % VA GEL: 1.0000 | Freq: Every day | VAGINAL | Status: DC

## 2015-03-30 NOTE — Telephone Encounter (Signed)
Spoke with patient. Advised of results as seen below from Dr.Jertson. She is agreeable and verbalizes understanding. Patient would like to use Metrogel at this time. Rx for Metrogel, 1 applicator per vagina q day x 5 days sent to pharmacy on file. Advised STD panel and GC/Chl testing were all negative. 08 recall placed. Aex scheduled for 03/28/2016 with Dr.Jertson.  Routing to provider for final review. Patient agreeable to disposition. Will close encounter.

## 2015-03-30 NOTE — Telephone Encounter (Signed)
-----   Message from Romualdo BolkJill Evelyn Jertson, MD sent at 03/30/2015  8:33 AM EDT ----- Please inform the patient that her vaginitis probe was + for BV and treat with flagyl (either oral or vaginal, her choice), no ETOH while on Flagyl.  Oral: Flagyl 500 mg BID x 7 days, or Vaginal: Metrogel, 1 applicator per vagina q day x 5 days. Her blood work was normal. Her vit D level was just in the normal range, she should take 800 IU of vit a d to keep it there. Her pap and cervical cultures are still pending.

## 2015-03-30 NOTE — Telephone Encounter (Signed)
Notes Recorded by Romualdo BolkJill Evelyn Jertson, MD on 03/30/2015 at 2:41 PM 08   Left message to call Josiyah Tozzi at [phone].

## 2015-03-30 NOTE — Telephone Encounter (Signed)
Patient returned call

## 2015-04-01 ENCOUNTER — Ambulatory Visit
Admission: RE | Admit: 2015-04-01 | Discharge: 2015-04-01 | Disposition: A | Discharge status: Home / Self Care | Payer: 59 | Source: Ambulatory Visit | Attending: Obstetrics and Gynecology | Admitting: Obstetrics and Gynecology

## 2015-04-01 DIAGNOSIS — N63 Unspecified lump in unspecified breast: Secondary | ICD-10-CM

## 2015-05-07 ENCOUNTER — Encounter: Payer: Self-pay | Admitting: Obstetrics and Gynecology

## 2015-05-07 ENCOUNTER — Ambulatory Visit (INDEPENDENT_AMBULATORY_CARE_PROVIDER_SITE_OTHER): Payer: 59 | Admitting: Obstetrics and Gynecology

## 2015-05-07 VITALS — BP 102/60 | HR 64 | Resp 16 | Wt 110.0 lb

## 2015-05-07 DIAGNOSIS — N63 Unspecified lump in breast: Secondary | ICD-10-CM

## 2015-05-07 DIAGNOSIS — N6324 Unspecified lump in the left breast, lower inner quadrant: Secondary | ICD-10-CM

## 2015-05-07 NOTE — Progress Notes (Signed)
Patient ID: Hayley Miranda, female   DOB: 06/15/1980, 35 y.o.   MRN: [id number] GYNECOLOGY  VISIT   HPI: 35 y.o.   Single  Caucasian  female   G1P1001 with Patient's last menstrual period was 04/13/2015.   here for  Left breast recheck. The patient was noted to have a pea sized lump in her left breast at her annual exam. Imaging was negative. She isn't able to feel the lump.   GYNECOLOGIC HISTORY: Patient's last menstrual period was 04/13/2015. Contraception:None Menopausal hormone therapy: None        OB History    Gravida Para Term Preterm AB TAB SAB Ectopic Multiple Living   1 1 1       1          Patient Active Problem List   Diagnosis Date Noted  . Onychomycosis 03/12/2015  . 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 CIGARETTE SMOKER 03/03/2008  . ALLERGIC RHINITIS CAUSE UNSPECIFIED 03/03/2008  . Attention deficit disorder 02/01/2007    Past Medical History  Diagnosis Date  . Blood transfusion   . CELLULITIS, FOOT, LEFT 09/16/2008  . Dermatophytosis of nail 02/01/2007        . Blood transfusion without reported diagnosis   . Abnormal Pap smear of cervix   . STD (sexually transmitted disease) 06/2009    trichamonus    Past Surgical History  Procedure Laterality Date  . Foot surgery Right 2010  . Colposcopy      Current Outpatient Prescriptions  Medication Sig Dispense Refill  . amphetamine-dextroamphetamine (ADDERALL) 20 MG tablet Take 1 tablet (20 mg total) by mouth 3 (three) times daily. May refill 4/31/17 90 tablet 0  . amphetamine-dextroamphetamine (ADDERALL) 20 MG tablet Take 1 tablet (20 mg total) by mouth 3 (three) times daily. May refill 06/03/15 90 tablet 0  . amphetamine-dextroamphetamine (ADDERALL) 20 MG tablet Take 1 tablet (20 mg total) by mouth 3 (three) times daily. May refill 6/31/17 90 tablet 0  . terbinafine (LAMISIL) 250 MG tablet Take 1 tablet (250 mg total) by mouth daily. (Patient not taking: Reported  on 05/07/2015) 90 tablet 0   No current facility-administered medications for this visit.     ALLERGIES: Sulfamethoxazole-trimethoprim  Family History  Problem Relation Age of Onset  . Hyperlipidemia Father     fhx  . Hypertension Father     fhx  . Cancer      prostate/fhx  . Osteoporosis Mother   . Lung cancer Maternal Grandmother   . Lung cancer Paternal Grandmother   . Lung cancer Paternal Grandfather   . Liver cancer Maternal Grandfather     Social History   Social History  . Marital Status: Single    Spouse Name: N/A  . Number of Children: N/A  . Years of Education: N/A   Occupational History  . Not on file.   Social History Main Topics  . Smoking status: Current Every Day Smoker -- 0.50 packs/day    Types: Cigarettes  . Smokeless tobacco: Never Used  . Alcohol Use: 3.6 oz/week    6 Standard drinks or equivalent per week  . Drug Use: No  . Sexual Activity:    Partners: Male   Other Topics Concern  . Not on file   Social History Narrative    Review of Systems  Constitutional: Negative.   HENT: Negative.   Eyes: Negative.   Respiratory: Negative.   Cardiovascular: Negative.   Gastrointestinal: Negative.   Genitourinary: Negative.  Musculoskeletal: Negative.   Skin: Negative.   Neurological: Negative.   Endo/Heme/Allergies: Negative.   Psychiatric/Behavioral: Negative.     PHYSICAL EXAMINATION:    BP 102/60 mmHg  Pulse 64  Resp 16  Wt 110 lb (49.896 kg)  LMP 04/13/2015    General appearance: alert, cooperative and appears stated age Breasts: stable pea sized lump in the left breast 1 cm from the areolar region. Minimally tender. No other lumps, no dimpling or nipple d/c. Lymph: no supraclavicular or axillary adenopathy  ASSESSMENT Stable left breast lump, negative imaging    PLAN Discussed breast self exam, patient able to feel the lump with assistance She will do monthly breast exams, return with any concerns Otherwise routine f/u    An After Visit Summary was printed and given to the patient.

## 2015-05-07 NOTE — Patient Instructions (Signed)
Breast Self-Awareness Practicing breast self-awareness may pick up problems early, prevent significant medical complications, and possibly save your life. By practicing breast self-awareness, you can become familiar with how your breasts look and feel and if your breasts are changing. This allows you to notice changes early. It can also offer you some reassurance that your breast health is good. One way to learn what is normal for your breasts and whether your breasts are changing is to do a breast self-exam. If you find a lump or something that was not present in the past, it is best to contact your caregiver right away. Other findings that should be evaluated by your caregiver include nipple discharge, especially if it is bloody; skin changes or reddening; areas where the skin seems to be pulled in (retracted); or new lumps and bumps. Breast pain is seldom associated with cancer (malignancy), but should also be evaluated by a caregiver. HOW TO PERFORM A BREAST SELF-EXAM The best time to examine your breasts is 5-7 days after your menstrual period is over. During menstruation, the breasts are lumpier, and it may be more difficult to pick up changes. If you do not menstruate, have reached menopause, or had your uterus removed (hysterectomy), you should examine your breasts at regular intervals, such as monthly. If you are breastfeeding, examine your breasts after a feeding or after using a breast pump. Breast implants do not decrease the risk for lumps or tumors, so continue to perform breast self-exams as recommended. Talk to your caregiver about how to determine the difference between the implant and breast tissue. Also, talk about the amount of pressure you should use during the exam. Over time, you will become more familiar with the variations of your breasts and more comfortable with the exam. A breast self-exam requires you to remove all your clothes above the waist. 1. Look at your breasts and nipples.  Stand in front of a mirror in a room with good lighting. With your hands on your hips, push your hands firmly downward. Look for a difference in shape, contour, and size from one breast to the other (asymmetry). Asymmetry includes puckers, dips, or bumps. Also, look for skin changes, such as reddened or scaly areas on the breasts. Look for nipple changes, such as discharge, dimpling, repositioning, or redness. 2. Carefully feel your breasts. This is best done either in the shower or tub while using soapy water or when flat on your back. Place the arm (on the side of the breast you are examining) above your head. Use the pads (not the fingertips) of your three middle fingers on your opposite hand to feel your breasts. Start in the underarm area and use  inch (2 cm) overlapping circles to feel your breast. Use 3 different levels of pressure (light, medium, and firm pressure) at each circle before moving to the next circle. The light pressure is needed to feel the tissue closest to the skin. The medium pressure will help to feel breast tissue a little deeper, while the firm pressure is needed to feel the tissue close to the ribs. Continue the overlapping circles, moving downward over the breast until you feel your ribs below your breast. Then, move one finger-width towards the center of the body. Continue to use the  inch (2 cm) overlapping circles to feel your breast as you move slowly up toward the collar bone (clavicle) near the base of the neck. Continue the up and down exam using all 3 pressures until you reach the   middle of the chest. Do this with each breast, carefully feeling for lumps or changes. 3.  Keep a written record with breast changes or normal findings for each breast. By writing this information down, you do not need to depend only on memory for size, tenderness, or location. Write down where you are in your menstrual cycle, if you are still menstruating. Breast tissue can have some lumps or  thick tissue. However, see your caregiver if you find anything that concerns you.  SEEK MEDICAL CARE IF:  You see a change in shape, contour, or size of your breasts or nipples.   You see skin changes, such as reddened or scaly areas on the breasts or nipples.   You have an unusual discharge from your nipples.   You feel a new lump or unusually thick areas.    This information is not intended to replace advice given to you by your health care provider. Make sure you discuss any questions you have with your health care provider.   Document Released: 12/20/2004 Document Revised: 12/07/2011 Document Reviewed: 04/06/2011 Elsevier Interactive Patient Education 2016 [employer]  

## 2015-05-19 ENCOUNTER — Telehealth: Payer: Self-pay | Admitting: Emergency Medicine

## 2015-05-19 NOTE — Telephone Encounter (Signed)
-----   Message from Romualdo BolkJill Evelyn Jertson, MD sent at 05/18/2015  3:51 PM EDT ----- Regarding: RE: Mammogram  Yes, thanks! ----- Message -----    From: Joeseph Amorracy L Calirose Mccance, RN    Sent: 05/18/2015   2:58 PM      To: Romualdo BolkJill Evelyn Jertson, MD Subject: Mammogram                                      Dr. Oscar LaJertson,  Patient in mammogram hold for follow up breast check with you completed on 05/07/15. Okay to remove from hold?

## 2015-05-19 NOTE — Telephone Encounter (Signed)
Out of hold per Dr. Jertson  

## 2015-06-10 ENCOUNTER — Encounter (HOSPITAL_COMMUNITY): Payer: Self-pay | Admitting: *Deleted

## 2015-06-10 ENCOUNTER — Ambulatory Visit (HOSPITAL_COMMUNITY)
Admission: EM | Admit: 2015-06-10 | Discharge: 2015-06-10 | Disposition: A | Discharge status: Home / Self Care | Payer: 59 | Attending: Emergency Medicine | Admitting: Emergency Medicine

## 2015-06-10 DIAGNOSIS — IMO0002 Reserved for concepts with insufficient information to code with codable children: Secondary | ICD-10-CM

## 2015-06-10 DIAGNOSIS — T148 Other injury of unspecified body region: Secondary | ICD-10-CM

## 2015-06-10 DIAGNOSIS — Z23 Encounter for immunization: Secondary | ICD-10-CM | POA: Diagnosis not present

## 2015-06-10 MED ORDER — TETANUS-DIPHTH-ACELL PERTUSSIS 5-2.5-18.5 LF-MCG/0.5 IM SUSP
INTRAMUSCULAR | Status: AC
  Filled 2015-06-10: qty 0.5

## 2015-06-10 MED ORDER — TETANUS-DIPHTH-ACELL PERTUSSIS 5-2.5-18.5 LF-MCG/0.5 IM SUSP
0.5000 mL | Freq: Once | INTRAMUSCULAR | Status: AC
  Administered 2015-06-10: 0.5 mL via INTRAMUSCULAR

## 2015-06-10 NOTE — ED Notes (Signed)
Pt  Sustained  A  Laceration  To  Her       Hand  Today  With a  Sharp  Clean   Knife  The  Laceration  Was  On her  [place]

## 2015-06-10 NOTE — ED Provider Notes (Signed)
CSN: [id number]     Arrival date & time 06/10/15  1446 History   First MD Initiated Contact with Patient 06/10/15 1611     No chief complaint on file.  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 Blood transfusion   . CELLULITIS, FOOT, LEFT 09/16/2008  . Dermatophytosis of nail 02/01/2007        . Blood transfusion without reported diagnosis   . Abnormal Pap smear of cervix   . STD (sexually transmitted disease) 06/2009    trichamonus   Past Surgical History  Procedure Laterality Date  . Foot surgery Right 2010  . Colposcopy     Family History  Problem Relation Age of Onset  . Hyperlipidemia Father     fhx  . Hypertension Father     fhx  . Cancer      prostate/fhx  . Osteoporosis Mother   . Lung cancer Maternal Grandmother   . Lung cancer Paternal Grandmother   . Lung cancer Paternal Grandfather   . Liver cancer Maternal Grandfather    Social History  Substance Use Topics  . Smoking status: Current Every Day Smoker -- 0.50 packs/day    Types: Cigarettes  . Smokeless tobacco: Never Used  . Alcohol Use: 3.6 oz/week    6 Standard drinks or equivalent per week   OB History    Gravida Para Term Preterm AB TAB SAB Ectopic Multiple Living   1 1 1       1      Review of Systems  Denies: HEADACHE, NAUSEA, ABDOMINAL PAIN, CHEST PAIN, CONGESTION, DYSURIA, SHORTNESS OF BREATH  Allergies  Sulfamethoxazole-trimethoprim  Home Medications   Prior to Admission medications   Medication Sig Start Date End Date Taking? Authorizing Provider  amphetamine-dextroamphetamine  (ADDERALL) 20 MG tablet Take 1 tablet (20 mg total) by mouth 3 (three) times daily. May refill 4/31/17 03/12/15   Shelva MajesticStephen O Hunter, MD  amphetamine-dextroamphetamine (ADDERALL) 20 MG tablet Take 1 tablet (20 mg total) by mouth 3 (three) times daily. May refill 06/03/15 03/12/15   Shelva MajesticStephen O Hunter, MD  amphetamine-dextroamphetamine (ADDERALL) 20 MG tablet Take 1 tablet (20 mg total) by mouth 3 (three) times daily. May refill 6/31/17 03/12/15   Shelva MajesticStephen O Hunter, MD  terbinafine (LAMISIL) 250 MG tablet Take 1 tablet (250 mg total) by mouth daily. Patient not taking: Reported on 05/07/2015 03/12/15   Shelva MajesticStephen O Hunter, MD   Meds Ordered and Administered this Visit  Medications - No data to display  BP 136/83 mmHg  Pulse 80  Temp(Src) 98.4 F (36.9 C) (Oral)  Resp 16  SpO2 100% No data found.   Physical Exam NURSES NOTES AND VITAL SIGNS REVIEWED. CONSTITUTIONAL: Well developed, well nourished, no acute distress HEENT: normocephalic, atraumatic EYES: Conjunctiva normal NECK:normal ROM, supple, no adenopathy PULMONARY:No respiratory distress, normal effort ABDOMINAL: Soft, ND, NT BS+, No CVAT MUSCULOSKELETAL: Normal ROM of all extremities, left palm just below the second MCP 2 cm gaping laceration tender to palpation no tendon or nerve involvement. Sensorimotor function intact distally. SKIN: warm and dry without rash PSYCHIATRIC: Mood and affect, behavior are normal  ED Course  .Marland Kitchen.Laceration Repair Date/Time: 06/10/2015 4:52 PM Performed  by: Tharon Aquas Authorized by: Charm Rings Consent: Verbal consent obtained. Risks and benefits: risks, benefits and alternatives were discussed Consent given by: patient Patient understanding: patient states understanding of the procedure being performed Patient identity confirmed: verbally with patient and arm band Time out: Immediately prior to procedure a "time out" was called to verify the correct patient, procedure, equipment, support staff and  site/side marked as required. Body area: upper extremity Location details: left hand Laceration length: 2.2 cm Foreign bodies: no foreign bodies Tendon involvement: none Nerve involvement: none Vascular damage: no Anesthesia: local infiltration Local anesthetic: lidocaine 1% without epinephrine Anesthetic total: 4 ml Patient sedated: no Preparation: Patient was prepped and draped in the usual sterile fashion. Irrigation solution: saline Irrigation method: syringe Amount of cleaning: standard Debridement: none Degree of undermining: none Skin closure: 4-0 nylon Number of sutures: 4 Technique: simple Approximation: close Approximation difficulty: simple Dressing: antibiotic ointment, 4x4 sterile gauze and pressure dressing Patient tolerance: Patient tolerated the procedure well with no immediate complications Comments: Patient states her tetanus is up-to-date   (including critical care time)  Labs Review Labs Reviewed - No data to display  Imaging Review No results found.   Visual Acuity Review  Right Eye Distance:   Left Eye Distance:   Bilateral Distance:    Right Eye Near:   Left Eye Near:    Bilateral Near:       Expect full recovery from laceration of left hand  MDM   1. Laceration     Patient is reassured that there are no issues that require transfer to higher level of care at this time or additional tests. Patient is advised to continue home symptomatic treatment. Patient is advised that if there are new or worsening symptoms to attend the emergency department, contact primary care provider, or return to UC. Instructions of care provided discharged home in stable condition.    THIS NOTE WAS GENERATED USING A VOICE RECOGNITION SOFTWARE PROGRAM. ALL REASONABLE EFFORTS  WERE MADE TO PROOFREAD THIS DOCUMENT FOR ACCURACY.  I have verbally reviewed the discharge instructions with the patient. A printed AVS was given to the patient.  All questions were  answered prior to discharge.      Tharon Aquas, PA 06/10/15 [phone]

## 2015-07-29 ENCOUNTER — Telehealth: Payer: Self-pay | Admitting: Family Medicine

## 2015-07-29 NOTE — Telephone Encounter (Signed)
Pt would like generic adderall 20 mg #90. Pt needs rxs for next 3 month

## 2015-07-30 ENCOUNTER — Other Ambulatory Visit: Payer: Self-pay

## 2015-07-30 DIAGNOSIS — F988 Other specified behavioral and emotional disorders with onset usually occurring in childhood and adolescence: Secondary | ICD-10-CM

## 2015-07-30 MED ORDER — AMPHETAMINE-DEXTROAMPHETAMINE 20 MG PO TABS: 20.0000 mg | ORAL_TABLET | Freq: Three times a day (TID) | ORAL | 0 refills | Status: DC

## 2015-07-30 NOTE — Telephone Encounter (Signed)
Prescriptions filled as requested. Patient is aware and confirmed that she has an appointment in 3 months to be seen for follow up.

## 2015-09-25 ENCOUNTER — Telehealth: Payer: Self-pay | Admitting: Family Medicine

## 2015-09-25 MED ORDER — IVERMECTIN 0.5 % EX LOTN: TOPICAL_LOTION | CUTANEOUS | 0 refills | Status: DC

## 2015-09-25 NOTE — Telephone Encounter (Signed)
Sent in rx for patient. Discussed by phone.

## 2015-09-25 NOTE — Telephone Encounter (Signed)
Please see message and advise 

## 2015-09-25 NOTE — Telephone Encounter (Signed)
Spoke to pt , she said daughter has lice and was prescribed Sklice and wanted to know if Dr.Hunter would prescribe for her. Told her I will have to check with Dr.Hunter and get back to her. PT verbalized understanding.

## 2015-09-25 NOTE — Telephone Encounter (Signed)
Pt states her daughter has had head lice for 2 weeks. Pt has tried the NIX 2 times, but has not cured. Her daughter was prescribed the rx for the lice from her pediatrician. Mom would like to know if you will do for her as well.  [facility]/ [address]

## 2015-10-08 ENCOUNTER — Ambulatory Visit: Payer: 59 | Admitting: Family Medicine

## 2015-10-09 ENCOUNTER — Other Ambulatory Visit: Payer: Self-pay | Admitting: Family Medicine

## 2015-10-09 ENCOUNTER — Encounter: Payer: Self-pay | Admitting: Family Medicine

## 2015-10-09 ENCOUNTER — Ambulatory Visit (INDEPENDENT_AMBULATORY_CARE_PROVIDER_SITE_OTHER): Payer: 59 | Admitting: Family Medicine

## 2015-10-09 VITALS — BP 104/68 | HR 85 | Temp 98.0°F | Wt 111.4 lb

## 2015-10-09 DIAGNOSIS — Z23 Encounter for immunization: Secondary | ICD-10-CM | POA: Diagnosis not present

## 2015-10-09 DIAGNOSIS — F900 Attention-deficit hyperactivity disorder, predominantly inattentive type: Secondary | ICD-10-CM | POA: Diagnosis not present

## 2015-10-09 DIAGNOSIS — F17200 Nicotine dependence, unspecified, uncomplicated: Secondary | ICD-10-CM | POA: Diagnosis not present

## 2015-10-09 DIAGNOSIS — Z79899 Other long term (current) drug therapy: Secondary | ICD-10-CM | POA: Diagnosis not present

## 2015-10-09 MED ORDER — AMPHETAMINE-DEXTROAMPHETAMINE 20 MG PO TABS
20.0000 mg | ORAL_TABLET | Freq: Three times a day (TID) | ORAL | 0 refills | Status: DC
Start: 2015-10-09 — End: 2016-01-28

## 2015-10-09 NOTE — Progress Notes (Signed)
Pre visit review using our clinic review tool, if applicable. No additional management support is needed unless otherwise documented below in the visit note. 

## 2015-10-09 NOTE — Patient Instructions (Signed)
Refilled meds. Call in 3 months for refill. Return in 6 months for visit  Can start lamisil after derm treatments then call for lab visit 6 weeks later for liver function tests  Health Maintenance Due  Topic Date Due  . INFLUENZA VACCINE - before you leave 08/04/2015

## 2015-10-09 NOTE — Progress Notes (Signed)
Subjective:  Hayley Miranda is a 35 y.o. year old very pleasant female patient who presents for/with See problem oriented charting ROS- no unintentional weight loss, chest pain, shortness o fbreath, palpitations.see any ROS included in HPI as well.   Past Medical History-  Patient Active Problem List   Diagnosis Date Noted  . CIGARETTE SMOKER 03/03/2008    Priority: High  . Attention deficit disorder 02/01/2007    Priority: Medium  . Onychomycosis 03/12/2015    Priority: Low  . 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 ALLERGIC RHINITIS CAUSE UNSPECIFIED 03/03/2008    Priority: Low    Medications- reviewed and updated Current Outpatient Prescriptions  Medication Sig Dispense Refill  . amphetamine-dextroamphetamine (ADDERALL) 20 MG tablet Take 1 tablet (20 mg total) by mouth 3 (three) times daily. May refill 08/03/15 90 tablet 0  . amphetamine-dextroamphetamine (ADDERALL) 20 MG tablet Take 1 tablet (20 mg total) by mouth 3 (three) times daily. May refill 09/03/15 90 tablet 0  . amphetamine-dextroamphetamine (ADDERALL) 20 MG tablet Take 1 tablet (20 mg total) by mouth 3 (three) times daily. May refill 9/31/17 90 tablet 0  . terbinafine (LAMISIL) 250 MG tablet Take 1 tablet (250 mg total) by mouth daily. (Patient not taking: Reported on 05/07/2015) 90 tablet 0     Objective: BP 104/68 (BP Location: Left Arm, Patient Position: Sitting, Cuff Size: Normal)   Pulse 85   Temp 98 F (36.7 C) (Oral)   Wt 111 lb 6.4 oz (50.5 kg)   SpO2 99%   BMI 19.42 kg/m  Gen: NAD, resting comfortably, thin, talks quickly CV: RRR no murmurs rubs or gallops Lungs: CTAB no crackles, wheeze, rhonchi Ext: no edema Skin: warm, dry  Assessment/Plan:  Onychomycosis S: plan was for treatment with lamisil for 3 months back in march. 6 week LFT check needed. Holding off until dermatologist treatments- laser for "dark spots on face" A/P: reminded  patient once she starts medicine to follow up 6 weeks for LFTs  Attention deficit disorder S: has remained on Adderall IR 20mg  TID- helping her with her focus. Less on weekends but working on saturdays- notices inattention obviously more when off meds A/P: UDS today, reasonable control- continue current regimen   CIGARETTE SMOKER S: 3 cigarettes a day prior now down to 3 a week- just when out drinking A/P: congratulations given but advised complete cessation  6 months  Orders Placed This Encounter  Procedures  . Flu Vaccine QUAD 36+ mos IM  . Drug Screen, Urine  . Hepatic Function Panel    Standing Status:   Future    Standing Expiration Date:   10/08/2016    Meds ordered this encounter  Medications  . amphetamine-dextroamphetamine (ADDERALL) 20 MG tablet    Sig: Take 1 tablet (20 mg total) by mouth 3 (three) times daily. May fill today.    Dispense:  90 tablet    Refill:  0  . amphetamine-dextroamphetamine (ADDERALL) 20 MG tablet    Sig: Take 1 tablet (20 mg total) by mouth 3 (three) times daily. May fill in 1 months    Dispense:  90 tablet    Refill:  0  . amphetamine-dextroamphetamine (ADDERALL) 20 MG tablet    Sig: Take 1 tablet (20 mg total) by mouth 3 (three) times daily. May fill in 2 months    Dispense:  90 tablet    Refill:  0    Return precautions advised.  Jeannett SeniorStephen  Yong Channel, MD

## 2015-10-09 NOTE — Assessment & Plan Note (Signed)
S: 3 cigarettes a day prior now down to 3 a week- just when out drinking A/P: congratulations given but advised complete cessation

## 2015-10-09 NOTE — Assessment & Plan Note (Signed)
S: has remained on Adderall IR 20mg  TID- helping her with her focus. Less on weekends but working on saturdays- notices inattention obviously more when off meds A/P: UDS today, reasonable control- continue current regimen

## 2015-10-14 ENCOUNTER — Other Ambulatory Visit: Payer: Self-pay | Admitting: Family Medicine

## 2015-10-15 LAB — PAIN MGMT BASE W/O CONF, UR
Benzodiazepines: NEGATIVE ng/mL (ref <100)
COCAINE METABOLITE: NEGATIVE ng/mL (ref <150)
Creatinine: 103.6 mg/dL (ref >=20.0)
OPIATES: NEGATIVE ng/mL (ref <100)
OXIDANT: NEGATIVE ug/mL (ref <200)
OXYCODONE: NEGATIVE ng/mL (ref <100)
pH: 7 (ref 4.5–9.0)

## 2015-10-21 ENCOUNTER — Telehealth: Payer: Self-pay | Admitting: Obstetrics and Gynecology

## 2015-10-21 NOTE — Telephone Encounter (Signed)
Patient wants to speak with a nurse and possibly schedule an appointment.  She has a new partner.

## 2015-10-21 NOTE — Telephone Encounter (Signed)
Spoke with patient. Patient request appointment for STD testing. Patient states she has a new partner and would like to be tested. Patient denies any symptoms or complaints at this time. Patient scheduled for 10/22/15 at 8:30 am with Dr. Oscar LaJertson. Patient verbalizes understanding and is agreeable to date and time. Last AEX 03/26/15.   Routing to provider for final review. Patient is agreeable to disposition. Will close encounter.

## 2015-10-22 ENCOUNTER — Ambulatory Visit (INDEPENDENT_AMBULATORY_CARE_PROVIDER_SITE_OTHER): Payer: 59 | Admitting: Obstetrics and Gynecology

## 2015-10-22 ENCOUNTER — Encounter: Payer: Self-pay | Admitting: Obstetrics and Gynecology

## 2015-10-22 VITALS — BP 122/82 | HR 84 | Resp 13 | Wt 112.0 lb

## 2015-10-22 DIAGNOSIS — Z205 Contact with and (suspected) exposure to viral hepatitis: Secondary | ICD-10-CM | POA: Diagnosis not present

## 2015-10-22 DIAGNOSIS — Z113 Encounter for screening for infections with a predominantly sexual mode of transmission: Secondary | ICD-10-CM | POA: Diagnosis not present

## 2015-10-22 LAB — HEPATITIS C ANTIBODY: HCV Ab: NEGATIVE

## 2015-10-22 NOTE — Progress Notes (Signed)
GYNECOLOGY  VISIT   HPI: 35 y.o.   Single  Caucasian  female   G1P1001 with Patient's last menstrual period was 10/15/2015.   here for STD screening. She had a recent partner, someone told her that he had hepatitis C. She has no symptoms. She used condoms, except one time (it broke). No longer with him.     GYNECOLOGIC HISTORY: Patient's last menstrual period was 10/15/2015. Contraception:none Menopausal hormone therapy: none         OB History    Gravida Para Term Preterm AB Living   1 1 1     1    SAB TAB Ectopic Multiple Live Births           1         Patient Active Problem List   Diagnosis Date Noted  . Onychomycosis 03/12/2015  . 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 CIGARETTE SMOKER 03/03/2008  . ALLERGIC RHINITIS CAUSE UNSPECIFIED 03/03/2008  . Attention deficit disorder 02/01/2007    Past Medical History:  Diagnosis Date  . Abnormal Pap smear of cervix   . Blood transfusion   . Blood transfusion without reported diagnosis   . CELLULITIS, FOOT, LEFT 09/16/2008  . Dermatophytosis of nail 02/01/2007       . STD (sexually transmitted disease) 06/2009   trichamonus    Past Surgical History:  Procedure Laterality Date  . COLPOSCOPY    . FOOT SURGERY Right 2010    Current Outpatient Prescriptions  Medication Sig Dispense Refill  . amphetamine-dextroamphetamine (ADDERALL) 20 MG tablet Take 1 tablet (20 mg total) by mouth 3 (three) times daily. May fill today. 90 tablet 0  . amphetamine-dextroamphetamine (ADDERALL) 20 MG tablet Take 1 tablet (20 mg total) by mouth 3 (three) times daily. May fill in 1 months 90 tablet 0  . amphetamine-dextroamphetamine (ADDERALL) 20 MG tablet Take 1 tablet (20 mg total) by mouth 3 (three) times daily. May fill in 2 months 90 tablet 0   No current facility-administered medications for this visit.      ALLERGIES: Sulfamethoxazole-trimethoprim  Family History  Problem Relation Age  of Onset  . Hyperlipidemia Father     fhx  . Hypertension Father     fhx  . Cancer      prostate/fhx  . Osteoporosis Mother   . Lung cancer Maternal Grandmother   . Lung cancer Paternal Grandmother   . Lung cancer Paternal Grandfather   . Liver cancer Maternal Grandfather     Social History   Social History  . Marital status: Single    Spouse name: N/A  . Number of children: N/A  . Years of education: N/A   Occupational History  . Not on file.   Social History Main Topics  . Smoking status: Current Every Day Smoker    Packs/day: 0.50    Types: Cigarettes  . Smokeless tobacco: Never Used  . Alcohol use 3.6 oz/week    6 Standard drinks or equivalent per week  . Drug use: No  . Sexual activity: Not Currently    Partners: Male   Other Topics Concern  . Not on file   Social History Narrative  . No narrative on file    Review of Systems  Constitutional: Negative.   HENT: Negative.   Eyes: Negative.   Respiratory: Negative.   Cardiovascular: Negative.   Gastrointestinal: Negative.   Genitourinary: Negative.   Musculoskeletal: Negative.   Skin: Negative.   Neurological: Negative.  Endo/Heme/Allergies: Negative.   Psychiatric/Behavioral: Negative.     PHYSICAL EXAMINATION:    BP 122/82 (BP Location: Right Arm, Patient Position: Sitting, Cuff Size: Normal)   Pulse 84   Resp 13   Wt 112 lb (50.8 kg)   LMP 10/15/2015   BMI 19.53 kg/m     General appearance: alert, cooperative and appears stated age  Pelvic: External genitalia:  no lesions              Urethra:  normal appearing urethra with no masses, tenderness or lesions              Bartholins and Skenes: normal                 Vagina: normal appearing vagina with normal color and discharge, no lesions              Cervix: no lesions              Bimanual Exam:  Uterus:  normal size, contour, position, consistency, mobility, non-tender              Adnexa: no mass, fullness, tenderness                Chaperone was present for exam.  ASSESSMENT Screening for STD, possible exposure to hepatitis C    PLAN STD testing   An After Visit Summary was printed and given to the patient.

## 2015-10-23 LAB — GC/CHLAMYDIA PROBE AMP
CT Probe RNA: NOT DETECTED
GC Probe RNA: NOT DETECTED

## 2015-10-23 LAB — STD PANEL
HIV 1&2 Ab, 4th Generation: NONREACTIVE
Hepatitis B Surface Ag: NEGATIVE

## 2016-01-28 ENCOUNTER — Telehealth: Payer: Self-pay | Admitting: Family Medicine

## 2016-01-28 ENCOUNTER — Other Ambulatory Visit: Payer: Self-pay

## 2016-01-28 DIAGNOSIS — F900 Attention-deficit hyperactivity disorder, predominantly inattentive type: Secondary | ICD-10-CM

## 2016-01-28 MED ORDER — AMPHETAMINE-DEXTROAMPHETAMINE 20 MG PO TABS: 20.0000 mg | ORAL_TABLET | Freq: Three times a day (TID) | ORAL | 0 refills | Status: DC

## 2016-01-28 NOTE — Telephone Encounter (Signed)
Prescriptions printed and placed up front for pick up. Called patient to let her know.

## 2016-01-28 NOTE — Telephone Encounter (Signed)
Pt request refill of the following: amphetamine-dextroamphetamine (ADDERALL) 20 MG tablet ° ° °Phamacy: ° °

## 2016-02-08 DIAGNOSIS — B349 Viral infection, unspecified: Secondary | ICD-10-CM | POA: Diagnosis not present

## 2016-03-03 HISTORY — PX: INTRAUTERINE DEVICE (IUD) INSERTION: SHX5877

## 2016-03-28 ENCOUNTER — Ambulatory Visit (INDEPENDENT_AMBULATORY_CARE_PROVIDER_SITE_OTHER): Payer: 59 | Admitting: Obstetrics and Gynecology

## 2016-03-28 ENCOUNTER — Encounter: Payer: Self-pay | Admitting: Obstetrics and Gynecology

## 2016-03-28 VITALS — BP 124/80 | HR 88 | Resp 16 | Ht 63.5 in | Wt 109.0 lb

## 2016-03-28 DIAGNOSIS — Z01419 Encounter for gynecological examination (general) (routine) without abnormal findings: Secondary | ICD-10-CM | POA: Diagnosis not present

## 2016-03-28 DIAGNOSIS — Z124 Encounter for screening for malignant neoplasm of cervix: Secondary | ICD-10-CM

## 2016-03-28 DIAGNOSIS — Z0000 Encounter for general adult medical examination without abnormal findings: Secondary | ICD-10-CM | POA: Diagnosis not present

## 2016-03-28 DIAGNOSIS — Z3009 Encounter for other general counseling and advice on contraception: Secondary | ICD-10-CM

## 2016-03-28 DIAGNOSIS — Z113 Encounter for screening for infections with a predominantly sexual mode of transmission: Secondary | ICD-10-CM

## 2016-03-28 DIAGNOSIS — N898 Other specified noninflammatory disorders of vagina: Secondary | ICD-10-CM | POA: Diagnosis not present

## 2016-03-28 LAB — COMPREHENSIVE METABOLIC PANEL
ALBUMIN: 4.9 g/dL (ref 3.6–5.1)
ALK PHOS: 71 U/L (ref 33–115)
ALT: 19 U/L (ref 6–29)
AST: 22 U/L (ref 10–30)
BILIRUBIN TOTAL: 0.4 mg/dL (ref 0.2–1.2)
BUN: 8 mg/dL (ref 7–25)
CALCIUM: 10.1 mg/dL (ref 8.6–10.2)
CO2: 25 mmol/L (ref 20–31)
Chloride: 101 mmol/L (ref 98–110)
Creat: 0.69 mg/dL (ref 0.50–1.10)
GLUCOSE: 91 mg/dL (ref 65–99)
Potassium: 4.3 mmol/L (ref 3.5–5.3)
Sodium: 137 mmol/L (ref 135–146)
Total Protein: 7.7 g/dL (ref 6.1–8.1)

## 2016-03-28 LAB — CBC
HEMATOCRIT: 42.7 % (ref 35.0–45.0)
HEMOGLOBIN: 14.2 g/dL (ref 11.7–15.5)
MCH: 30.7 pg (ref 27.0–33.0)
MCHC: 33.3 g/dL (ref 32.0–36.0)
MCV: 92.4 fL (ref 80.0–100.0)
MPV: 9.1 fL (ref 7.5–12.5)
Platelets: 398 10*3/uL (ref 140–400)
RBC: 4.62 MIL/uL (ref 3.80–5.10)
RDW: 13.2 % (ref 11.0–15.0)
WBC: 5.4 10*3/uL (ref 3.8–10.8)

## 2016-03-28 NOTE — Patient Instructions (Signed)
EXERCISE AND DIET:  We recommended that you start or continue a regular exercise program for good health. Regular exercise means any activity that makes your heart beat faster and makes you sweat.  We recommend exercising at least 30 minutes per day at least 3 days a week, preferably 4 or 5.  We also recommend a diet low in fat and sugar.  Inactivity, poor dietary choices and obesity can cause diabetes, heart attack, stroke, and kidney damage, among others.    ALCOHOL AND SMOKING:  Women should limit their alcohol intake to no more than 7 drinks/beers/glasses of wine (combined, not each!) per week. Moderation of alcohol intake to this level decreases your risk of breast cancer and liver damage. And of course, no recreational drugs are part of a healthy lifestyle.  And absolutely no smoking or even second hand smoke. Most people know smoking can cause heart and lung diseases, but did you know it also contributes to weakening of your bones? Aging of your skin?  Yellowing of your teeth and nails?  CALCIUM AND VITAMIN D:  Adequate intake of calcium and Vitamin D are recommended.  The recommendations for exact amounts of these supplements seem to change often, but generally speaking 600 mg of calcium (either carbonate or citrate) and 800 units of Vitamin D per day seems prudent. Certain women may benefit from higher intake of Vitamin D.  If you are among these women, your doctor will have told you during your visit.    PAP SMEARS:  Pap smears, to check for cervical cancer or precancers,  have traditionally been done yearly, although recent scientific advances have shown that most women can have pap smears less often.  However, every woman still should have a physical exam from her gynecologist every year. It will include a breast check, inspection of the vulva and vagina to check for abnormal growths or skin changes, a visual exam of the cervix, and then an exam to evaluate the size and shape of the uterus and  ovaries.  And after 38 years of age, a rectal exam is indicated to check for rectal cancers. We will also provide age appropriate advice regarding health maintenance, like when you should have certain vaccines, screening for sexually transmitted diseases, bone density testing, colonoscopy, mammograms, etc.   MAMMOGRAMS:  All women over 40 years old should have a yearly mammogram. Many facilities now offer a "3D" mammogram, which may cost around $50 extra out of pocket. If possible,  we recommend you accept the option to have the 3D mammogram performed.  It both reduces the number of women who will be called back for extra views which then turn out to be normal, and it is better than the routine mammogram at detecting truly abnormal areas.    COLONOSCOPY:  Colonoscopy to screen for colon cancer is recommended for all women at age 50.  We know, you hate the idea of the prep.  We agree, BUT, having colon cancer and not knowing it is worse!!  Colon cancer so often starts as a polyp that can be seen and removed at colonscopy, which can quite literally save your life!  And if your first colonoscopy is normal and you have no family history of colon cancer, most women don't have to have it again for 10 years.  Once every ten years, you can do something that may end up saving your life, right?  We will be happy to help you get it scheduled when you are ready.    Be sure to check your insurance coverage so you understand how much it will cost.  It may be covered as a preventative service at no cost, but you should check your particular policy.      Breast Self-Awareness Breast self-awareness means being familiar with how your breasts look and feel. It involves checking your breasts regularly and reporting any changes to your health care provider. Practicing breast self-awareness is important. A change in your breasts can be a sign of a serious medical problem. Being familiar with how your breasts look and feel allows  you to find any problems early, when treatment is more likely to be successful. All women should practice breast self-awareness, including women who have had breast implants. How to do a breast self-exam One way to learn what is normal for your breasts and whether your breasts are changing is to do a breast self-exam. To do a breast self-exam: Look for Changes   1. Remove all the clothing above your waist. 2. Stand in front of a mirror in a room with good lighting. 3. Put your hands on your hips. 4. Push your hands firmly downward. 5. Compare your breasts in the mirror. Look for differences between them (asymmetry), such as:  Differences in shape.  Differences in size.  Puckers, dips, and bumps in one breast and not the other. 6. Look at each breast for changes in your skin, such as:  Redness.  Scaly areas. 7. Look for changes in your nipples, such as:  Discharge.  Bleeding.  Dimpling.  Redness.  A change in position. Feel for Changes   Carefully feel your breasts for lumps and changes. It is best to do this while lying on your back on the floor and again while sitting or standing in the shower or tub with soapy water on your skin. Feel each breast in the following way:  Place the arm on the side of the breast you are examining above your head.  Feel your breast with the other hand.  Start in the nipple area and make  inch (2 cm) overlapping circles to feel your breast. Use the pads of your three middle fingers to do this. Apply light pressure, then medium pressure, then firm pressure. The light pressure will allow you to feel the tissue closest to the skin. The medium pressure will allow you to feel the tissue that is a little deeper. The firm pressure will allow you to feel the tissue close to the ribs.  Continue the overlapping circles, moving downward over the breast until you feel your ribs below your breast.  Move one finger-width toward the center of the body.  Continue to use the  inch (2 cm) overlapping circles to feel your breast as you move slowly up toward your collarbone.  Continue the up and down exam using all three pressures until you reach your armpit. Write Down What You Find   Write down what is normal for each breast and any changes that you find. Keep a written record with breast changes or normal findings for each breast. By writing this information down, you do not need to depend only on memory for size, tenderness, or location. Write down where you are in your menstrual cycle, if you are still menstruating. If you are having trouble noticing differences in your breasts, do not get discouraged. With time you will become more familiar with the variations in your breasts and more comfortable with the exam. How often should I examine my   breasts? Examine your breasts every month. If you are breastfeeding, the best time to examine your breasts is after a feeding or after using a breast pump. If you menstruate, the best time to examine your breasts is 5-7 days after your period is over. During your period, your breasts are lumpier, and it may be more difficult to notice changes. When should I see my health care provider? See your health care provider if you notice:  A change in shape or size of your breasts or nipples.  A change in the skin of your breast or nipples, such as a reddened or scaly area.  Unusual discharge from your nipples.  A lump or thick area that was not there before.  Pain in your breasts.  Anything that concerns you. This information is not intended to replace advice given to you by your health care provider. Make sure you discuss any questions you have with your health care provider. Document Released: 12/20/2004 Document Revised: 05/28/2015 Document Reviewed: 11/09/2014 [employer] Interactive Patient Education  2017 [employer]  

## 2016-03-28 NOTE — Progress Notes (Signed)
36 y.o. G1P1001 SingleCaucasianF here for annual exam.  Recently sexually active again. Using condoms. No pain with intercourse. She felt slightly itchy prior to her cycle, currently on her cycle, not feeling itchy at the moment. Worried that she has a vaginitis. No vaginal odor, she has noticed a little increase in white, pasty vaginal d/c.  Period Cycle (Days): 28 Period Duration (Days): 5-6 days  Period Pattern: Regular Menstrual Flow: Heavy, Moderate Menstrual Control: Tampon Menstrual Control Change Freq (Hours): changes tampon every 2-3 hours  Dysmenorrhea: None  She was noted to have a breast lump last year. Negative imaging, she doesn't feel it.   Patient's last menstrual period was 03/28/2016.          Sexually active: Yes.    The current method of family planning is none.    Exercising: Yes.    weight/ cardio class  Smoker:  yes  Health Maintenance: Pap:  03-26-15 WNL NEG HR HPV 01-19-11 ASCUS History of abnormal Pap:  Yes HX CIN 1 colpo BX, 2010 MMG:  04-01-15 Recommend continued management of patient's left breast palpable abnormality a clinical basis. Otherwise, recommend beginning annual screening mammography Colonoscopy:  Never BMD:   Never TDaP:  06-10-15  Gardasil: N/A   reports that she has been smoking Cigarettes.  She has been smoking about 0.50 packs per day. She has never used smokeless tobacco. She reports that she drinks about 3.6 oz of alcohol per week . She reports that she does not use drugs.She works in a [employer]. Daughter is 10.  Past Medical History:  Diagnosis Date  . Abnormal Pap smear of cervix   . Blood transfusion   . Blood transfusion without reported diagnosis   . CELLULITIS, FOOT, LEFT 09/16/2008  . Dermatophytosis of nail 02/01/2007       . STD (sexually transmitted disease) 06/2009   trichamonus    Past Surgical History:  Procedure Laterality Date  . COLPOSCOPY    . FOOT SURGERY Right 2010    Current Outpatient Prescriptions  Medication  Sig Dispense Refill  . amphetamine-dextroamphetamine (ADDERALL) 20 MG tablet Take 1 tablet (20 mg total) by mouth 3 (three) times daily. May fill today. 90 tablet 0  . amphetamine-dextroamphetamine (ADDERALL) 20 MG tablet Take 1 tablet (20 mg total) by mouth 3 (three) times daily. May fill in 1 months 90 tablet 0  . amphetamine-dextroamphetamine (ADDERALL) 20 MG tablet Take 1 tablet (20 mg total) by mouth 3 (three) times daily. May fill in 2 months 90 tablet 0   No current facility-administered medications for this visit.     Family History  Problem Relation Age of Onset  . Hyperlipidemia Father     fhx  . Hypertension Father     fhx  . Cancer      prostate/fhx  . Osteoporosis Mother   . Lung cancer Maternal Grandmother   . Lung cancer Paternal Grandmother   . Lung cancer Paternal Grandfather   . Liver cancer Maternal Grandfather     Review of Systems  Constitutional: Negative.   HENT: Negative.   Eyes: Negative.   Respiratory: Negative.   Cardiovascular: Negative.   Gastrointestinal: Negative.   Endocrine: Negative.   Genitourinary: Negative.   Musculoskeletal: Negative.   Skin: Negative.   Allergic/Immunologic: Negative.   Neurological: Negative.   Psychiatric/Behavioral: Negative.     Exam:   BP 124/80 (BP Location: Right Arm, Patient Position: Sitting, Cuff Size: Normal)   Pulse 88   Resp 16  Ht 5' 3.5" (1.613 m)   Wt 109 lb (49.4 kg)   LMP 03/28/2016   BMI 19.01 kg/m   Weight change: @WEIGHTCHANGE @ Height:   Height: 5' 3.5" (161.3 cm)  Ht Readings from Last 3 Encounters:  03/28/16 5' 3.5" (1.613 m)  03/26/15 5' 3.5" (1.613 m)  12/30/14 5\' 3"  (1.6 m)    General appearance: alert, cooperative and appears stated age Head: Normocephalic, without obvious abnormality, atraumatic Neck: no adenopathy, supple, symmetrical, trachea midline and thyroid normal to inspection and palpation Lungs: clear to auscultation bilaterally Cardiovascular: regular rate and  rhythm Breasts:stable pea sized lump in the left breast at 7 o'clock, 1 cm from the areolar region. No other lumps, no dimpling.  Heart: regular rate and rhythm Abdomen: soft, non-tender; bowel sounds normal; no masses,  no organomegaly Extremities: extremities normal, atraumatic, no cyanosis or edema Skin: Skin color, texture, turgor normal. No rashes or lesions Lymph nodes: Cervical, supraclavicular, and axillary nodes normal. No abnormal inguinal nodes palpated Neurologic: Grossly normal   Pelvic: External genitalia:  no lesions              Urethra:  normal appearing urethra with no masses, tenderness or lesions              Bartholins and Skenes: normal                 Vagina: normal appearing vagina with normal color and discharge, no lesions              Cervix: no lesions               Bimanual Exam:  Uterus:  normal size, contour, position, consistency, mobility, non-tender and anteverted              Adnexa: no mass, fullness, tenderness               Rectovaginal: Confirms               Anus:  normal sphincter tone, no lesions  Chaperone was present for exam.  A:  Well Woman with normal exam  Contraception, interested in the mirena IUD  Vaginal discharge, itching  Stable left breast lump, negative imagina  P:   Return for mirena IUD insertion  Reviewed risks and side effects of mirena IUD   Pap with hpv  Screening STD  Wet prep probe  Discussed breast self exam  Discussed calcium and vit D intake  Normal lipids last year  Other screening labs

## 2016-03-29 ENCOUNTER — Ambulatory Visit (INDEPENDENT_AMBULATORY_CARE_PROVIDER_SITE_OTHER): Payer: 59 | Admitting: Obstetrics and Gynecology

## 2016-03-29 ENCOUNTER — Encounter: Payer: Self-pay | Admitting: Obstetrics and Gynecology

## 2016-03-29 ENCOUNTER — Telehealth: Payer: Self-pay | Admitting: *Deleted

## 2016-03-29 VITALS — BP 118/60 | HR 88 | Resp 16 | Wt 111.0 lb

## 2016-03-29 DIAGNOSIS — B9689 Other specified bacterial agents as the cause of diseases classified elsewhere: Secondary | ICD-10-CM

## 2016-03-29 DIAGNOSIS — Z01812 Encounter for preprocedural laboratory examination: Secondary | ICD-10-CM

## 2016-03-29 DIAGNOSIS — Z30430 Encounter for insertion of intrauterine contraceptive device: Secondary | ICD-10-CM | POA: Diagnosis not present

## 2016-03-29 DIAGNOSIS — Z3009 Encounter for other general counseling and advice on contraception: Secondary | ICD-10-CM | POA: Diagnosis not present

## 2016-03-29 DIAGNOSIS — N760 Acute vaginitis: Secondary | ICD-10-CM | POA: Diagnosis not present

## 2016-03-29 LAB — HEPATITIS C ANTIBODY: HCV Ab: NEGATIVE

## 2016-03-29 LAB — WET PREP BY MOLECULAR PROBE
Candida species: NOT DETECTED
Gardnerella vaginalis: DETECTED — AB
Trichomonas vaginosis: NOT DETECTED

## 2016-03-29 LAB — VITAMIN D 25 HYDROXY (VIT D DEFICIENCY, FRACTURES): Vit D, 25-Hydroxy: 35 ng/mL (ref 30–100)

## 2016-03-29 LAB — POCT URINE PREGNANCY: Preg Test, Ur: NEGATIVE

## 2016-03-29 LAB — STD PANEL
HEP B S AG: NEGATIVE
HIV: NONREACTIVE

## 2016-03-29 LAB — IPS N GONORRHOEA AND CHLAMYDIA BY PCR

## 2016-03-29 MED ORDER — METRONIDAZOLE 500 MG PO TABS: 500.0000 mg | ORAL_TABLET | Freq: Two times a day (BID) | ORAL | 0 refills | Status: DC

## 2016-03-29 NOTE — Telephone Encounter (Signed)
She really isn't supposed to drink with oral or vaginal flagyl. The risk is smaller with the vaginal flagyl. She could wait until after the weekend to start the medication.  Another option is clindamycin vaginal cream 2%. One applicator daily x 7 days. She shouldn't use this with latex condoms.

## 2016-03-29 NOTE — Progress Notes (Signed)
GYNECOLOGY  VISIT   HPI: 36 y.o.   Single  Caucasian  female   G1P1001 with Patient's last menstrual period was 03/28/2016.   here for Mirena IUD insertion   Yesterday the patient was seen for her annual exam, she was feeling itchy prior to her cycle and requested vaginitis testing. Currently she denies a vaginal odor or watery vaginal d/c. The vaginitis probe returned with BV.  GYNECOLOGIC HISTORY: Patient's last menstrual period was 03/28/2016. Contraception:none  Menopausal hormone therapy: none        OB History    Gravida Para Term Preterm AB Living   1 1 1     1    SAB TAB Ectopic Multiple Live Births           1         Patient Active Problem List   Diagnosis Date Noted  . Onychomycosis 03/12/2015  . 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 CIGARETTE SMOKER 03/03/2008  . ALLERGIC RHINITIS CAUSE UNSPECIFIED 03/03/2008  . Attention deficit disorder 02/01/2007    Past Medical History:  Diagnosis Date  . Abnormal Pap smear of cervix   . Blood transfusion   . Blood transfusion without reported diagnosis   . CELLULITIS, FOOT, LEFT 09/16/2008  . Dermatophytosis of nail 02/01/2007       . STD (sexually transmitted disease) 06/2009   trichamonus    Past Surgical History:  Procedure Laterality Date  . COLPOSCOPY    . FOOT SURGERY Right 2010    Current Outpatient Prescriptions  Medication Sig Dispense Refill  . amphetamine-dextroamphetamine (ADDERALL) 20 MG tablet Take 1 tablet (20 mg total) by mouth 3 (three) times daily. May fill today. 90 tablet 0  . amphetamine-dextroamphetamine (ADDERALL) 20 MG tablet Take 1 tablet (20 mg total) by mouth 3 (three) times daily. May fill in 1 months 90 tablet 0  . amphetamine-dextroamphetamine (ADDERALL) 20 MG tablet Take 1 tablet (20 mg total) by mouth 3 (three) times daily. May fill in 2 months 90 tablet 0   No current facility-administered medications for this visit.       ALLERGIES: Sulfamethoxazole-trimethoprim  Family History  Problem Relation Age of Onset  . Hyperlipidemia Father     fhx  . Hypertension Father     fhx  . Cancer      prostate/fhx  . Osteoporosis Mother   . Lung cancer Maternal Grandmother   . Lung cancer Paternal Grandmother   . Lung cancer Paternal Grandfather   . Liver cancer Maternal Grandfather     Social History   Social History  . Marital status: Single    Spouse name: N/A  . Number of children: N/A  . Years of education: N/A   Occupational History  . Not on file.   Social History Main Topics  . Smoking status: Current Every Day Smoker    Packs/day: 0.50    Types: Cigarettes  . Smokeless tobacco: Never Used  . Alcohol use 3.6 oz/week    6 Standard drinks or equivalent per week  . Drug use: No  . Sexual activity: Yes    Partners: Male   Other Topics Concern  . Not on file   Social History Narrative  . No narrative on file    Review of Systems  Constitutional: Negative.   HENT: Negative.   Eyes: Negative.   Respiratory: Negative.   Cardiovascular: Negative.   Gastrointestinal: Negative.   Genitourinary: Negative.   Musculoskeletal: Negative.  Skin: Negative.   Neurological: Negative.   Endo/Heme/Allergies: Negative.   Psychiatric/Behavioral: Negative.     PHYSICAL EXAMINATION:    BP 118/60 (BP Location: Right Arm, Patient Position: Sitting, Cuff Size: Normal)   Pulse 88   Resp 16   Wt 111 lb (50.3 kg)   LMP 03/28/2016   BMI 19.35 kg/m     General appearance: alert, cooperative and appears stated age  Pelvic: External genitalia:  no lesions              Urethra:  normal appearing urethra with no masses, tenderness or lesions              Bartholins and Skenes: normal                 Vagina: normal appearing vagina with normal color, no discharge seen              Cervix: no lesions  The risks of the mirena IUD were reviewed with the patient, including infection, abnormal bleeding  and uterine perfortion. Consent was signed.  A speculum was placed in the vagina, the cervix was cleansed with betadine. A tenaculum was placed on the cervix, the uterus sounded to 6-7cm. The cervix was dilated to a 5 hagar dilator  The mirena IUD was inserted without difficulty. The string were cut to 3-4 cm. The tenaculum was removed. Slight oozing from the tenaculum site was stopped with pressure.   The patient tolerated the procedure well.    Chaperone was present for exam.  ASSESSMENT Contraception, desires mirena IUD BV on vaginitis probe, no current symptoms of BV, but will treat secondary to IUD insertion    PLAN Mirena placed F/U in 1 month Will treat  BV, no ETOH Call with any concerns   An After Visit Summary was printed and given to the patient.

## 2016-03-29 NOTE — Patient Instructions (Signed)
IUD Post-procedure Instructions . Cramping is common.  You may take Ibuprofen, Aleve, or Tylenol for the cramping.  This should resolve within 24 hours.   . You may have a small amount of spotting.  You should wear a mini pad for the next few days. . You may have intercourse in 24 hours. . You need to call the office if you have any pelvic pain, fever, heavy bleeding, or foul smelling vaginal discharge. . Shower or bathe as normal  Vaginitis Vaginitis is an inflammation of the vagina. It is most often caused by a change in the normal balance of the bacteria and yeast that live in the vagina. This change in balance causes an overgrowth of certain bacteria or yeast, which causes the inflammation. There are different types of vaginitis, but the most common types are:  Bacterial vaginosis.  Yeast infection (candidiasis).  Trichomoniasis vaginitis. This is a sexually transmitted infection (STI).  Viral vaginitis.  Atrophic vaginitis.  Allergic vaginitis. What are the causes? The cause depends on the type of vaginitis. Vaginitis can be caused by:  Bacteria (bacterial vaginosis).  Yeast (yeast infection).  A parasite (trichomoniasis vaginitis)  A virus (viral vaginitis).  Low hormone levels (atrophic vaginitis). Low hormone levels can occur during pregnancy, breastfeeding, or after menopause.  Irritants, such as bubble baths, scented tampons, and feminine sprays (allergic vaginitis). Other factors can change the normal balance of the yeast and bacteria that live in the vagina. These include:  Antibiotic medicines.  Poor hygiene.  Diaphragms, vaginal sponges, spermicides, birth control pills, and intrauterine devices (IUD).  Sexual intercourse.  Infection.  Uncontrolled diabetes.  A weakened immune system. What are the signs or symptoms? Symptoms can vary depending on the cause of the vaginitis. Common symptoms include:  Abnormal vaginal discharge.  The discharge is  white, gray, or yellow with bacterial vaginosis.  The discharge is thick, white, and cheesy with a yeast infection.  The discharge is frothy and yellow or greenish with trichomoniasis.  A bad vaginal odor.  The odor is fishy with bacterial vaginosis.  Vaginal itching, pain, or swelling.  Painful intercourse.  Pain or burning when urinating. Sometimes there are no symptoms. How is this treated? Treatment will vary depending on the type of infection.  Bacterial vaginosis and trichomoniasis are often treated with antibiotic creams or pills.  Yeast infections are often treated with antifungal medicines, such as vaginal creams or suppositories.  Viral vaginitis has no cure, but symptoms can be treated with medicines that relieve discomfort. Your sexual partner should be treated as well.  Atrophic vaginitis may be treated with an estrogen cream, pill, suppository, or vaginal ring. If vaginal dryness occurs, lubricants and moisturizing creams may help. You may be told to avoid scented soaps, sprays, or douches.  Allergic vaginitis treatment involves quitting the use of the product that is causing the problem. Vaginal creams can be used to treat the symptoms. Follow these instructions at home:  Take all medicines as directed by your caregiver.  Keep your genital area clean and dry. Avoid soap and only rinse the area with water.  Avoid douching. It can remove the healthy bacteria in the vagina.  Do not use tampons or have sexual intercourse until your vaginitis has been treated. Use sanitary pads while you have vaginitis.  Wipe from front to back. This avoids the spread of bacteria from the rectum to the vagina.  Let air reach your genital area. ? Wear cotton underwear to decrease moisture buildup.  Avoid  wearing underwear while you sleep until your vaginitis is gone.  Avoid tight pants and underwear or nylons without a cotton panel.  Take off wet clothing (especially bathing  suits) as soon as possible.  Use mild, non-scented products. Avoid using irritants, such as:  Scented feminine sprays.  Fabric softeners.  Scented detergents.  Scented tampons.  Scented soaps or bubble baths.  Practice safe sex and use condoms. Condoms may prevent the spread of trichomoniasis and viral vaginitis. Contact a health care provider if:  You have abdominal pain.  You have symptoms that last for more than 2-3 days.  You have a fever and your symptoms suddenly get worse. This information is not intended to replace advice given to you by your health care provider. Make sure you discuss any questions you have with your health care provider. Document Released: 10/17/2006 Document Revised: 11/11/2015 Document Reviewed: 11/11/2015 Elsevier Interactive Patient Education  2017 [employer]

## 2016-03-29 NOTE — Telephone Encounter (Signed)
Patient called regarding the Flagyl RX sent in today. She is going to the beach this weekend and will be having a couple beers. She is asking is there something else she can take that will allow her to have drinks containing alcohol.  Please advise

## 2016-03-30 LAB — IPS PAP TEST WITH HPV

## 2016-03-30 MED ORDER — CLINDAMYCIN PHOSPHATE 2 % VA CREA: 1.0000 | TOPICAL_CREAM | Freq: Every day | VAGINAL | 0 refills | Status: DC

## 2016-03-30 NOTE — Telephone Encounter (Signed)
Left message to call regarding message below -eh 

## 2016-03-30 NOTE — Telephone Encounter (Signed)
Spoke with patient and gave Dr. Lorin GlassJertsons recommendations. Patient requested to have the clindamycin vaginal cream be sent in. RX sent to patients pharmacy -eh

## 2016-04-05 ENCOUNTER — Telehealth: Payer: Self-pay | Admitting: Obstetrics and Gynecology

## 2016-04-05 MED ORDER — CLINDAMYCIN PHOSPHATE 2 % VA CREA: 1.0000 | TOPICAL_CREAM | Freq: Every day | VAGINAL | 0 refills | Status: AC

## 2016-04-05 NOTE — Telephone Encounter (Signed)
Patient at the beach and forgot to take her Cleocin cream with her.  Would like to know if we can send a script to [facility] in [place] ph# [phone].

## 2016-04-05 NOTE — Telephone Encounter (Signed)
Yes, of course

## 2016-04-05 NOTE — Telephone Encounter (Signed)
Dr. Oscar La, please see patient message below and advise on refill?

## 2016-04-05 NOTE — Telephone Encounter (Signed)
Spoke with patient, advised new RX for Cleocin has been sent to requested pharmacy. Advised patient to follow up with pharmacy for filling. Patient verbalizes understanding and is agreeable.   Routing to provider for final review. Patient is agreeable to disposition. Will close encounter.

## 2016-04-08 ENCOUNTER — Ambulatory Visit: Payer: 59 | Admitting: Family Medicine

## 2016-04-25 ENCOUNTER — Ambulatory Visit (INDEPENDENT_AMBULATORY_CARE_PROVIDER_SITE_OTHER): Payer: 59 | Admitting: Family Medicine

## 2016-04-25 ENCOUNTER — Encounter: Payer: Self-pay | Admitting: Family Medicine

## 2016-04-25 DIAGNOSIS — F17200 Nicotine dependence, unspecified, uncomplicated: Secondary | ICD-10-CM

## 2016-04-25 DIAGNOSIS — F900 Attention-deficit hyperactivity disorder, predominantly inattentive type: Secondary | ICD-10-CM | POA: Diagnosis not present

## 2016-04-25 MED ORDER — AMPHETAMINE-DEXTROAMPHETAMINE 20 MG PO TABS
20.0000 mg | ORAL_TABLET | Freq: Three times a day (TID) | ORAL | 0 refills | Status: DC
Start: 2016-04-25 — End: 2016-08-01

## 2016-04-25 MED ORDER — AMPHETAMINE-DEXTROAMPHETAMINE 20 MG PO TABS: 20.0000 mg | ORAL_TABLET | Freq: Three times a day (TID) | ORAL | 0 refills | Status: DC

## 2016-04-25 MED ORDER — AMPHETAMINE-DEXTROAMPHETAMINE 20 MG PO TABS
20.0000 mg | ORAL_TABLET | Freq: Three times a day (TID) | ORAL | 0 refills | Status: DC
Start: 2016-04-25 — End: 2016-10-25

## 2016-04-25 NOTE — Assessment & Plan Note (Signed)
S: ADD has been well controlled on high dose adderall  TID.  UDS 10/09/15. Contract 09/10/14.  Original diagnosis in 6th grade- reviewed documentation and no original records A/P: doing well- refilled meds. BP ok. No side effects  Discussed getting adult diagnosis completed with Dr. Jason Fila within next year- over next 6 months to check with mom or [facility] [employer] where she was treated

## 2016-04-25 NOTE — Progress Notes (Signed)
Subjective:  Hayley Miranda is a 36 y.o. year old very pleasant female patient who presents for/with See problem oriented charting ROS- no unintentional weight loss, palpitations, chest pain, shortness of breath.    Past Medical History-  Patient Active Problem List   Diagnosis Date Noted  . CIGARETTE SMOKER 03/03/2008    Priority: High  . Attention deficit disorder 02/01/2007    Priority: Medium  . Onychomycosis 03/12/2015    Priority: Low  . 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 ALLERGIC RHINITIS CAUSE UNSPECIFIED 03/03/2008    Priority: Low    Medications- reviewed and updated Current Outpatient Prescriptions  Medication Sig Dispense Refill  . amphetamine-dextroamphetamine (ADDERALL) 20 MG tablet Take 1 tablet (20 mg total) by mouth 3 (three) times daily. May fill today. 90 tablet 0  . amphetamine-dextroamphetamine (ADDERALL) 20 MG tablet Take 1 tablet (20 mg total) by mouth 3 (three) times daily. May fill in 1 months 90 tablet 0  . amphetamine-dextroamphetamine (ADDERALL) 20 MG tablet Take 1 tablet (20 mg total) by mouth 3 (three) times daily. May fill in 2 months 90 tablet 0   No current facility-administered medications for this visit.     Objective: BP 134/86 (BP Location: Left Arm, Patient Position: Sitting, Cuff Size: Normal)   Pulse 92   Temp 98 F (36.7 C) (Oral)   Ht 5' 3.5" (1.613 m)   Wt 107 lb 3.2 oz (48.6 kg)   LMP 03/28/2016   SpO2 95%   BMI 18.69 kg/m  Gen: NAD, resting comfortably CV: RRR no murmurs rubs or gallops Lungs: CTAB no crackles, wheeze, rhonchi Abdomen: thin Ext: no edema Skin: warm, dry  Assessment/Plan:  Attention deficit disorder S: ADD has been well controlled on high dose adderall  TID.  UDS 10/09/15. Contract 09/10/14.  Original diagnosis in 6th grade- reviewed documentation and no original records A/P: doing well- refilled meds. BP ok. No side effects   Discussed getting adult diagnosis completed with Dr. Jason Fila within next year- over next 6 months to check with mom or [employer] [facility] [employer] where she was treated  CIGARETTE SMOKER smoking - last visit was down to 3 a a day- up from 3x a week with drinking and advised complete cessation. Trial gum  x 3 doses for final step to quitting- strongly encouraged  6 months See avs  Meds ordered this encounter  Medications  . amphetamine-dextroamphetamine (ADDERALL) 20 MG tablet    Sig: Take 1 tablet (20 mg total) by mouth 3 (three) times daily. May fill in 1 months    Dispense:  90 tablet    Refill:  0  . amphetamine-dextroamphetamine (ADDERALL) 20 MG tablet    Sig: Take 1 tablet (20 mg total) by mouth 3 (three) times daily. May fill in 2 months    Dispense:  90 tablet    Refill:  0  . amphetamine-dextroamphetamine (ADDERALL) 20 MG tablet    Sig: Take 1 tablet (20 mg total) by mouth 3 (three) times daily. May fill today.    Dispense:  90 tablet    Refill:  0   The duration of face-to-face time during this visit was greater than 15 minutes. Greater than 50% of this time was spent in counseling, explanation of diagnosis, planning of further management, and/or coordination of care including discussing why original diagnosis needed though I think her probability of ADD is very high.    Return precautions advised.  Jeannett Senior  Yong Channel, MD

## 2016-04-25 NOTE — Progress Notes (Signed)
Pre visit review using our clinic review tool, if applicable. No additional management support is needed unless otherwise documented below in the visit note. 

## 2016-04-25 NOTE — Assessment & Plan Note (Signed)
smoking - last visit was down to 3 a a day- up from 3x a week with drinking and advised complete cessation. Trial gum  x 3 doses for final step to quitting- strongly encouraged

## 2016-04-25 NOTE — Patient Instructions (Addendum)
Add nicotine gum   Up to 3x a day to take the final step to quit completely. You are so close ! 1800 quit now is a good resource as well.   Check with mom about records for original diagnosis or check with [employer]. If we cant find that by 6 month follow up, lets get an updated adult diagnosis with Dr. Jason Fila  Refilled meds. Call in 3 months for refill. Return in 6 months for visit

## 2016-04-27 ENCOUNTER — Encounter: Payer: Self-pay | Admitting: Obstetrics and Gynecology

## 2016-04-27 ENCOUNTER — Ambulatory Visit (INDEPENDENT_AMBULATORY_CARE_PROVIDER_SITE_OTHER): Payer: 59 | Admitting: Obstetrics and Gynecology

## 2016-04-27 VITALS — BP 118/70 | HR 88 | Resp 16 | Wt 108.0 lb

## 2016-04-27 DIAGNOSIS — Z30431 Encounter for routine checking of intrauterine contraceptive device: Secondary | ICD-10-CM | POA: Diagnosis not present

## 2016-04-27 NOTE — Progress Notes (Signed)
GYNECOLOGY  VISIT   HPI: 36 y.o.   Single  Caucasian  female   [id number] with Patient's last menstrual period was 03/28/2016.   here for IUD check. She had a mirena IUD inserted last month. She is having daily spotting, very light. No real cycle, no pain.   GYNECOLOGIC HISTORY: Patient's last menstrual period was 03/28/2016. Contraception:IUD (Mirena) Menopausal hormone therapy: none         OB History    Gravida Para Term Preterm AB Living   SAB TAB Ectopic Multiple Live Births           1         Patient Active Problem List   Diagnosis Date Noted  . Onychomycosis 03/12/2015  . 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 CIGARETTE SMOKER 03/03/2008  . ALLERGIC RHINITIS CAUSE UNSPECIFIED 03/03/2008  . Attention deficit disorder 02/01/2007    Past Medical History:  Diagnosis Date  . Abnormal Pap smear of cervix   . Blood transfusion   . Blood transfusion without reported diagnosis   . CELLULITIS, FOOT, LEFT 09/16/2008  . Dermatophytosis of nail 02/01/2007       . STD (sexually transmitted disease) 06/2009   trichamonus    Past Surgical History:  Procedure Laterality Date  . COLPOSCOPY    . FOOT SURGERY Right 2010  . INTRAUTERINE DEVICE (IUD) INSERTION  03/2016   Mirena     Current Outpatient Prescriptions  Medication Sig Dispense Refill  . amphetamine-dextroamphetamine (ADDERALL) 20 MG tablet Take 1 tablet (20 mg total) by mouth 3 (three) times daily. May fill in 1 months 90 tablet 0  . amphetamine-dextroamphetamine (ADDERALL) 20 MG tablet Take 1 tablet (20 mg total) by mouth 3 (three) times daily. May fill in 2 months 90 tablet 0  . amphetamine-dextroamphetamine (ADDERALL) 20 MG tablet Take 1 tablet (20 mg total) by mouth 3 (three) times daily. May fill today. 90 tablet 0  . levonorgestrel (MIRENA) 20 MCG/24HR IUD 1 each by Intrauterine route once.     No current facility-administered medications for this  visit.      ALLERGIES: Sulfamethoxazole-trimethoprim  Family History  Problem Relation Age of Onset  . Hyperlipidemia Father     fhx  . Hypertension Father     fhx  . Cancer      prostate/fhx  . Osteoporosis Mother   . Lung cancer Maternal Grandmother   . Lung cancer Paternal Grandmother   . Lung cancer Paternal Grandfather   . Liver cancer Maternal Grandfather     Social History   Social History  . Marital status: Single    Spouse name: N/A  . Number of children: N/A  . Years of education: N/A   Occupational History  . Not on file.   Social History Main Topics  . Smoking status: Current Every Day Smoker    Packs/day: 0.50    Types: Cigarettes  . Smokeless tobacco: Never Used  . Alcohol use 3.6 oz/week    6 Standard drinks or equivalent per week  . Drug use: No  . Sexual activity: Yes    Partners: Male   Other Topics Concern  . Not on file   Social History Narrative  . No narrative on file    Review of Systems  Constitutional: Negative.   HENT: Negative.   Eyes: Negative.   Respiratory: Ne71gative.   Cardiovascular: Negative.   Gastrointestinal: Negative.  Genitourinary:       Continuous spotting   Musculoskeletal: Negative.   Skin: Negative.   Neurological: Negative.   Endo/Heme/Allergies: Negative.   Psychiatric/Behavioral: Negative.     PHYSICAL EXAMINATION:    BP 118/70 (BP Location: Right Arm, Patient Position: Sitting, Cuff Size: Normal)   Pulse 88   Resp 16   Wt 108 lb (49 kg)   LMP 03/28/2016   BMI 18.83 kg/m     General appearance: alert, cooperative and appears stated age  Pelvic: External genitalia:  no lesions              Urethra:  normal appearing urethra with no masses, tenderness or lesions              Bartholins and Skenes: normal                 Vagina: normal appearing vagina with normal color and discharge, no lesions              Cervix: no lesions and IUD string 3 cm              Bimanual Exam:  Uterus:  normal  size, contour, position, consistency, mobility, non-tender              Adnexa: no mass, fullness, tenderness               Chaperone was present for exam.  ASSESSMENT IUD check, doing well    PLAN Routine f/u   An After Visit Summary was printed and given to the patient.

## 2016-07-29 ENCOUNTER — Telehealth: Payer: Self-pay | Admitting: Family Medicine

## 2016-07-29 NOTE — Telephone Encounter (Signed)
Pt need new Rx for Adderall.  Pt is aware of 3 business days for refills and someone will call when ready for pick up.  Pt will be out of town next and would like to see if she can get this today.

## 2016-08-01 ENCOUNTER — Other Ambulatory Visit: Payer: Self-pay

## 2016-08-01 DIAGNOSIS — F900 Attention-deficit hyperactivity disorder, predominantly inattentive type: Secondary | ICD-10-CM

## 2016-08-01 MED ORDER — AMPHETAMINE-DEXTROAMPHETAMINE 20 MG PO TABS: 20.0000 mg | ORAL_TABLET | Freq: Three times a day (TID) | ORAL | 0 refills | Status: DC

## 2016-08-01 NOTE — Telephone Encounter (Signed)
Patient was provided a 1 month prescription as Dr. Durene CalHunter is currently out of town. I did inform her when he returned we could do the other 2. She verbalized understanding

## 2016-08-22 ENCOUNTER — Telehealth: Payer: Self-pay | Admitting: Family Medicine

## 2016-08-22 NOTE — Telephone Encounter (Signed)
Pt needs new generic adderall 20 mg °

## 2016-08-24 ENCOUNTER — Other Ambulatory Visit: Payer: Self-pay

## 2016-08-24 DIAGNOSIS — F900 Attention-deficit hyperactivity disorder, predominantly inattentive type: Secondary | ICD-10-CM

## 2016-08-24 MED ORDER — AMPHETAMINE-DEXTROAMPHETAMINE 20 MG PO TABS: 20.0000 mg | ORAL_TABLET | Freq: Three times a day (TID) | ORAL | 0 refills | Status: DC

## 2016-08-24 NOTE — Telephone Encounter (Signed)
Prescriptions printed and placed at the front desk. Patient is aware

## 2016-10-25 ENCOUNTER — Encounter: Payer: Self-pay | Admitting: Family Medicine

## 2016-10-25 ENCOUNTER — Ambulatory Visit (INDEPENDENT_AMBULATORY_CARE_PROVIDER_SITE_OTHER): Payer: 59 | Admitting: Family Medicine

## 2016-10-25 VITALS — BP 120/74 | HR 98 | Temp 98.3°F | Ht 63.5 in | Wt 111.8 lb

## 2016-10-25 DIAGNOSIS — Z23 Encounter for immunization: Secondary | ICD-10-CM

## 2016-10-25 DIAGNOSIS — F17200 Nicotine dependence, unspecified, uncomplicated: Secondary | ICD-10-CM

## 2016-10-25 DIAGNOSIS — F900 Attention-deficit hyperactivity disorder, predominantly inattentive type: Secondary | ICD-10-CM

## 2016-10-25 DIAGNOSIS — Z79899 Other long term (current) drug therapy: Secondary | ICD-10-CM | POA: Diagnosis not present

## 2016-10-25 MED ORDER — AMPHETAMINE-DEXTROAMPHETAMINE 20 MG PO TABS
20.0000 mg | ORAL_TABLET | Freq: Three times a day (TID) | ORAL | 0 refills | Status: DC
Start: 2016-10-25 — End: 2017-01-24

## 2016-10-25 MED ORDER — AMPHETAMINE-DEXTROAMPHETAMINE 20 MG PO TABS: 20.0000 mg | ORAL_TABLET | Freq: Three times a day (TID) | ORAL | 0 refills | Status: DC

## 2016-10-25 NOTE — Progress Notes (Signed)
Subjective:  Hayley Miranda is a 36 y.o. year old very pleasant female patient who presents for/with See problem oriented charting ROS- No chest pain or shortness of breath. No headache or blurry vision. No unintentional weight loss   Past Medical History-  Patient Active Problem List   Diagnosis Date Noted  . CIGARETTE SMOKER 03/03/2008    Priority: High  . Attention deficit disorder 02/01/2007    Priority: Medium  . Onychomycosis 03/12/2015    Priority: Low  . 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 ALLERGIC RHINITIS CAUSE UNSPECIFIED 03/03/2008    Priority: Low    Medications- reviewed and updated Current Outpatient Prescriptions  Medication Sig Dispense Refill  . amphetamine-dextroamphetamine (ADDERALL) 20 MG tablet Take 1 tablet (20 mg total) by mouth 3 (three) times daily. May fill in 2 months 90 tablet 0  . amphetamine-dextroamphetamine (ADDERALL) 20 MG tablet Take 1 tablet (20 mg total) by mouth 3 (three) times daily. May fill today. 90 tablet 0  . amphetamine-dextroamphetamine (ADDERALL) 20 MG tablet Take 1 tablet (20 mg total) by mouth 3 (three) times daily. May fill in 1 months 90 tablet 0  . levonorgestrel (MIRENA) 20 MCG/24HR IUD 1 each by Intrauterine route once.     No current facility-administered medications for this visit.     Objective: BP 120/74 (BP Location: Left Arm, Patient Position: Sitting, Cuff Size: Normal)   Pulse 98   Temp 98.3 F (36.8 C) (Oral)   Ht 5' 3.5" (1.613 m)   Wt 111 lb 12.8 oz (50.7 kg)   SpO2 99%   BMI 19.49 kg/m  Gen: NAD, resting comfortably CV: RRR no murmurs rubs or gallops Lungs: CTAB no crackles, wheeze, rhonchi Abdomen: thin Ext: no edema  Assessment/Plan:  Attention deficit disorder S:  last visit "Discussed getting adult diagnosis completed with Dr. Jason Fila within next year- over next 6 months to check with mom or [facility] where she was  treated"  Symptoms well controlled on adderall 20mg  TID. Updated controlled substance contract today. She was unable to locate records- we discussed reasoning for this.  A/P: update UDS today with high risk medication. Updated controlled substance contract for [place] [facility]. Discussed adult eval with Dr. Jason Fila and gave handout. Strongly advised to complete before next visit.   CIGARETTE SMOKER S: pack every 3-4 days. Was down to 3 a day last time.  Did not try gum.  A/P: smoking is picking back up, strongly encouraged her to reverse trend/quit. She is contemplating using the gum. We discussed if has not quit by 40- considering stopping ADD meds given potential combined cardiac risks.    Future Appointments Date Time Provider Department Center  03/29/2017 1:15 PM Romualdo Bolk, MD [facility] None  04/25/2017 8:45 AM Durene Cal Aldine Contes, MD [facility] None   6 months  Orders Placed This Encounter  Procedures  . Flu Vaccine QUAD 36+ mos IM  . Pain Mgmt, Profile 8 w/Conf, U    Meds ordered this encounter  Medications  . amphetamine-dextroamphetamine (ADDERALL) 20 MG tablet    Sig: Take 1 tablet (20 mg total) by mouth 3 (three) times daily. May fill in 2 months    Dispense:  90 tablet    Refill:  0  . amphetamine-dextroamphetamine (ADDERALL) 20 MG tablet    Sig: Take 1 tablet (20 mg total) by mouth 3 (three) times daily. May fill today.    Dispense:  90  tablet    Refill:  0  . amphetamine-dextroamphetamine (ADDERALL) 20 MG tablet    Sig: Take 1 tablet (20 mg total) by mouth 3 (three) times daily. May fill in 1 months    Dispense:  90 tablet    Refill:  0   The duration of face-to-face time during this visit was greater than 15 minutes. Greater than 50% of this time was spent in counseling, explanation of diagnosis, planning of further management, and/or coordination of care including discussing need for adult eval add, discussing new contract, discussing risks of smoking along with  add meds.     Return precautions advised.  Tana ConchStephen Ayron Fillinger, MD

## 2016-10-25 NOTE — Assessment & Plan Note (Signed)
S:  last visit "Discussed getting adult diagnosis completed with Dr. Jason FilaBray within next year- over next 6 months to check with mom or [facility] where she was treated"  Symptoms well controlled on adderall 20mg  TID. Updated controlled substance contract today. She was unable to locate records- we discussed reasoning for this.  A/P: update UDS today with high risk medication. Updated controlled substance contract for [facility] [place] [facility]. Discussed adult eval with Dr. Jason FilaBray and gave handout. Strongly advised to complete before next visit.

## 2016-10-25 NOTE — Assessment & Plan Note (Signed)
S: pack every 3-4 days. Was down to 3 a day last time.  Did not try gum.  A/P: smoking is picking back up, strongly encouraged her to reverse trend/quit. She is contemplating using the gum. We discussed if has not quit by 40- considering stopping ADD meds given potential combined cardiac risks.

## 2016-10-25 NOTE — Patient Instructions (Signed)
Once again from last visit "Add nicotine gum 2mg   Up to 3x a day to take the final step to quit completely. You are so close ! 1800 quit now is a good resource as well. " Will need to come off ADD meds if unable to quit smoking by 38.   Call Dr. Jason FilaBray for updated adult diagnosis of ADD_ want to have this on file to continue to prescribe ADD meds long term  Refilled meds. Call in 3 months for refill. Return in 6 months for visit

## 2016-10-28 LAB — PAIN MGMT, PROFILE 8 W/CONF, U
6 Acetylmorphine: NEGATIVE ng/mL (ref <10)
AMPHETAMINES: POSITIVE ng/mL — AB (ref <500)
Alcohol Metabolites: POSITIVE ng/mL — AB (ref <500)
Amphetamine: 5048 ng/mL — ABNORMAL HIGH (ref <250)
BUPRENORPHINE, URINE: NEGATIVE ng/mL (ref <5)
Benzodiazepines: NEGATIVE ng/mL (ref <100)
COCAINE METABOLITE: NEGATIVE ng/mL (ref <150)
CREATININE: 137 mg/dL
ETHYL SULFATE (ETS): 7327 ng/mL — AB (ref <100)
Ethyl Glucuronide (ETG): 36011 ng/mL — ABNORMAL HIGH (ref <500)
MARIJUANA METABOLITE: NEGATIVE ng/mL (ref <20)
MDMA: NEGATIVE ng/mL (ref <500)
Methamphetamine: NEGATIVE ng/mL (ref <250)
OXIDANT: NEGATIVE ug/mL (ref <200)
Opiates: NEGATIVE ng/mL (ref <100)
Oxycodone: NEGATIVE ng/mL (ref <100)
PH: 6.87 (ref 4.5–9.0)

## 2017-01-24 ENCOUNTER — Telehealth: Payer: Self-pay | Admitting: Family Medicine

## 2017-01-24 ENCOUNTER — Other Ambulatory Visit: Payer: Self-pay

## 2017-01-24 DIAGNOSIS — F900 Attention-deficit hyperactivity disorder, predominantly inattentive type: Secondary | ICD-10-CM

## 2017-01-24 MED ORDER — AMPHETAMINE-DEXTROAMPHETAMINE 20 MG PO TABS: 20.0000 mg | ORAL_TABLET | Freq: Three times a day (TID) | ORAL | 0 refills | Status: DC

## 2017-01-24 NOTE — Telephone Encounter (Signed)
Called patient and let her know that her prescriptions would be placed at the front desk for her to pick up at her convenience

## 2017-01-24 NOTE — Telephone Encounter (Signed)
Requesting refill of Adderall  LOV 10/25/16 with Dr. Durene CalHunter  Filled 10/25/17   #90.

## 2017-01-24 NOTE — Telephone Encounter (Signed)
Please advise on the note below 

## 2017-01-24 NOTE — Telephone Encounter (Signed)
Copied from CRM [id number]. Topic: Quick Communication - Rx Refill/Question >> Jan 24, 2017  3:24 PM Landry MellowFoltz, Melissa J wrote: Medication: adderrall    Has the patient contacted their pharmacy? No. Pick up  (Agent: If no, request that the patient contact the pharmacy for the refill.)   Preferred Pharmacy (with phone number or street name): call when ready to pick up [phone]   Agent: Please be advised that RX refills may take up to 3 business days. We ask that you follow-up with your pharmacy.

## 2017-02-13 ENCOUNTER — Encounter: Payer: Self-pay | Admitting: *Deleted

## 2017-02-13 NOTE — Telephone Encounter (Signed)
This encounter was created in error - please disregard.

## 2017-03-10 ENCOUNTER — Telehealth: Payer: Self-pay | Admitting: Obstetrics and Gynecology

## 2017-03-10 NOTE — Telephone Encounter (Signed)
Patient says she has an infection. Was offered a 4:00 appointment this afternoon and she can not come at that time. Would like nurse to give her a call.

## 2017-03-10 NOTE — Telephone Encounter (Signed)
Spoke with patient. Patient states that she is having thick white vaginal discharge and itching that started 2 days ago. Is unable to be seen for an appointment. Advised may use OTC Monistat 3 with OTC hydrocortisone ointment externally for symptoms and treatment of possible yeast infection. Advised if symptoms persist or worsen with treatment will need to be seen in office for further evaluation. Patient is agreeable.  Routing to provider for final review. Patient agreeable to disposition. Will close encounter.

## 2017-03-29 ENCOUNTER — Other Ambulatory Visit: Payer: Self-pay

## 2017-03-29 ENCOUNTER — Ambulatory Visit: Payer: 59 | Admitting: Obstetrics and Gynecology

## 2017-03-29 ENCOUNTER — Encounter: Payer: Self-pay | Admitting: Obstetrics and Gynecology

## 2017-03-29 VITALS — BP 130/80 | HR 88 | Resp 14 | Ht 63.75 in | Wt 104.0 lb

## 2017-03-29 DIAGNOSIS — Z113 Encounter for screening for infections with a predominantly sexual mode of transmission: Secondary | ICD-10-CM | POA: Diagnosis not present

## 2017-03-29 DIAGNOSIS — Z30431 Encounter for routine checking of intrauterine contraceptive device: Secondary | ICD-10-CM

## 2017-03-29 DIAGNOSIS — Z01419 Encounter for gynecological examination (general) (routine) without abnormal findings: Secondary | ICD-10-CM

## 2017-03-29 DIAGNOSIS — N762 Acute vulvitis: Secondary | ICD-10-CM | POA: Diagnosis not present

## 2017-03-29 DIAGNOSIS — Z0000 Encounter for general adult medical examination without abnormal findings: Secondary | ICD-10-CM | POA: Diagnosis not present

## 2017-03-29 DIAGNOSIS — N6324 Unspecified lump in the left breast, lower inner quadrant: Secondary | ICD-10-CM

## 2017-03-29 NOTE — Patient Instructions (Signed)
EXERCISE AND DIET:  We recommended that you start or continue a regular exercise program for good health. Regular exercise means any activity that makes your heart beat faster and makes you sweat.  We recommend exercising at least 30 minutes per day at least 3 days a week, preferably 4 or 5.  We also recommend a diet low in fat and sugar.  Inactivity, poor dietary choices and obesity can cause diabetes, heart attack, stroke, and kidney damage, among others.    ALCOHOL AND SMOKING:  Women should limit their alcohol intake to no more than 7 drinks/beers/glasses of wine (combined, not each!) per week. Moderation of alcohol intake to this level decreases your risk of breast cancer and liver damage. And of course, no recreational drugs are part of a healthy lifestyle.  And absolutely no smoking or even second hand smoke. Most people know smoking can cause heart and lung diseases, but did you know it also contributes to weakening of your bones? Aging of your skin?  Yellowing of your teeth and nails?  CALCIUM AND VITAMIN D:  Adequate intake of calcium and Vitamin D are recommended.  The recommendations for exact amounts of these supplements seem to change often, but generally speaking 600 mg of calcium (either carbonate or citrate) and 800 units of Vitamin D per day seems prudent. Certain women may benefit from higher intake of Vitamin D.  If you are among these women, your doctor will have told you during your visit.    PAP SMEARS:  Pap smears, to check for cervical cancer or precancers,  have traditionally been done yearly, although recent scientific advances have shown that most women can have pap smears less often.  However, every woman still should have a physical exam from her gynecologist every year. It will include a breast check, inspection of the vulva and vagina to check for abnormal growths or skin changes, a visual exam of the cervix, and then an exam to evaluate the size and shape of the uterus and  ovaries.  And after 40 years of age, a rectal exam is indicated to check for rectal cancers. We will also provide age appropriate advice regarding health maintenance, like when you should have certain vaccines, screening for sexually transmitted diseases, bone density testing, colonoscopy, mammograms, etc.   MAMMOGRAMS:  All women over 40 years old should have a yearly mammogram. Many facilities now offer a "3D" mammogram, which may cost around $50 extra out of pocket. If possible,  we recommend you accept the option to have the 3D mammogram performed.  It both reduces the number of women who will be called back for extra views which then turn out to be normal, and it is better than the routine mammogram at detecting truly abnormal areas.    COLONOSCOPY:  Colonoscopy to screen for colon cancer is recommended for all women at age 50.  We know, you hate the idea of the prep.  We agree, BUT, having colon cancer and not knowing it is worse!!  Colon cancer so often starts as a polyp that can be seen and removed at colonscopy, which can quite literally save your life!  And if your first colonoscopy is normal and you have no family history of colon cancer, most women don't have to have it again for 10 years.  Once every ten years, you can do something that may end up saving your life, right?  We will be happy to help you get it scheduled when you are ready.    Be sure to check your insurance coverage so you understand how much it will cost.  It may be covered as a preventative service at no cost, but you should check your particular policy.      Breast Self-Awareness Breast self-awareness means being familiar with how your breasts look and feel. It involves checking your breasts regularly and reporting any changes to your health care provider. Practicing breast self-awareness is important. A change in your breasts can be a sign of a serious medical problem. Being familiar with how your breasts look and feel allows  you to find any problems early, when treatment is more likely to be successful. All women should practice breast self-awareness, including women who have had breast implants. How to do a breast self-exam One way to learn what is normal for your breasts and whether your breasts are changing is to do a breast self-exam. To do a breast self-exam: Look for Changes  1. Remove all the clothing above your waist. 2. Stand in front of a mirror in a room with good lighting. 3. Put your hands on your hips. 4. Push your hands firmly downward. 5. Compare your breasts in the mirror. Look for differences between them (asymmetry), such as: ? Differences in shape. ? Differences in size. ? Puckers, dips, and bumps in one breast and not the other. 6. Look at each breast for changes in your skin, such as: ? Redness. ? Scaly areas. 7. Look for changes in your nipples, such as: ? Discharge. ? Bleeding. ? Dimpling. ? Redness. ? A change in position. Feel for Changes  Carefully feel your breasts for lumps and changes. It is best to do this while lying on your back on the floor and again while sitting or standing in the shower or tub with soapy water on your skin. Feel each breast in the following way:  Place the arm on the side of the breast you are examining above your head.  Feel your breast with the other hand.  Start in the nipple area and make  inch (2 cm) overlapping circles to feel your breast. Use the pads of your three middle fingers to do this. Apply light pressure, then medium pressure, then firm pressure. The light pressure will allow you to feel the tissue closest to the skin. The medium pressure will allow you to feel the tissue that is a little deeper. The firm pressure will allow you to feel the tissue close to the ribs.  Continue the overlapping circles, moving downward over the breast until you feel your ribs below your breast.  Move one finger-width toward the center of the body.  Continue to use the  inch (2 cm) overlapping circles to feel your breast as you move slowly up toward your collarbone.  Continue the up and down exam using all three pressures until you reach your armpit.  Write Down What You Find  Write down what is normal for each breast and any changes that you find. Keep a written record with breast changes or normal findings for each breast. By writing this information down, you do not need to depend only on memory for size, tenderness, or location. Write down where you are in your menstrual cycle, if you are still menstruating. If you are having trouble noticing differences in your breasts, do not get discouraged. With time you will become more familiar with the variations in your breasts and more comfortable with the exam. How often should I examine my breasts? Examine   your breasts every month. If you are breastfeeding, the best time to examine your breasts is after a feeding or after using a breast pump. If you menstruate, the best time to examine your breasts is 5-7 days after your period is over. During your period, your breasts are lumpier, and it may be more difficult to notice changes. When should I see my health care provider? See your health care provider if you notice:  A change in shape or size of your breasts or nipples.  A change in the skin of your breast or nipples, such as a reddened or scaly area.  Unusual discharge from your nipples.  A lump or thick area that was not there before.  Pain in your breasts.  Anything that concerns you.  This information is not intended to replace advice given to you by your health care provider. Make sure you discuss any questions you have with your health care provider. Document Released: 12/20/2004 Document Revised: 05/28/2015 Document Reviewed: 11/09/2014 [employer] Interactive Patient Education  2018 [employer]  

## 2017-03-29 NOTE — Progress Notes (Signed)
37 y.o. G1P1001 SingleCaucasianF here for annual exam.  She has a mirena IUD, placed in 3/18. Occasional spotting, not for a while. No pain. She is sexually active, new partner x one month. Using condoms. No pain.  She thought she had a yeast infection last week, symptoms improved. Still with slight irritation and itching, no abnormal d/c in the last few days.     No LMP recorded. (Menstrual status: IUD).          Sexually active: Yes.    The current method of family planning is IUD.    Exercising: Yes.    cross fit  Smoker:  Yes, <1/2 a PPD  Health Maintenance: Pap:  03-28-16 WNL NEG HR HPV, 03-26-15 WNL NEG HR HPV  History of abnormal Pap:  Yes 01-19-11 ASCUS, HX CIN I Colpo BX 2010  MMG:  04-01-15 WNL  TDaP:  06-10-15 Gardasil: no    reports that she has been smoking cigarettes.  She has been smoking about 0.50 packs per day. She has never used smokeless tobacco. She reports that she drinks about 3.6 oz of alcohol per week. She reports that she does not use drugs. Works in a [employer]. Daughter is 34.   Past Medical History:  Diagnosis Date  . Abnormal Pap smear of cervix   . Blood transfusion   . Blood transfusion without reported diagnosis   . CELLULITIS, FOOT, LEFT 09/16/2008  . Dermatophytosis of nail 02/01/2007       . STD (sexually transmitted disease) 06/2009   trichamonus    Past Surgical History:  Procedure Laterality Date  . COLPOSCOPY    . FOOT SURGERY Right 2010  . INTRAUTERINE DEVICE (IUD) INSERTION  03/2016   Mirena     Current Outpatient Medications  Medication Sig Dispense Refill  . amphetamine-dextroamphetamine (ADDERALL) 20 MG tablet Take 1 tablet (20 mg total) by mouth 3 (three) times daily. May fill in 2 months 90 tablet 0  . amphetamine-dextroamphetamine (ADDERALL) 20 MG tablet Take 1 tablet (20 mg total) by mouth 3 (three) times daily. May fill today. 90 tablet 0  . amphetamine-dextroamphetamine (ADDERALL) 20 MG tablet Take 1 tablet (20 mg total) by mouth 3  (three) times daily. May fill in 1 months 90 tablet 0  . levonorgestrel (MIRENA) 20 MCG/24HR IUD 1 each by Intrauterine route once.     No current facility-administered medications for this visit.     Family History  Problem Relation Age of Onset  . Hyperlipidemia Father        fhx  . Hypertension Father        fhx  . Cancer Unknown        prostate/fhx  . Osteoporosis Mother   . Lung cancer Maternal Grandmother   . Lung cancer Paternal Grandmother   . Lung cancer Paternal Grandfather   . Liver cancer Maternal Grandfather     Review of Systems  Constitutional: Negative.   HENT: Negative.   Eyes: Negative.   Respiratory: Negative.   Cardiovascular: Negative.   Gastrointestinal: Negative.   Endocrine: Negative.   Genitourinary: Negative.   Musculoskeletal: Negative.   Skin: Negative.   Allergic/Immunologic: Negative.   Neurological: Negative.   Psychiatric/Behavioral: Negative.     Exam:   BP 130/80 (BP Location: Right Arm, Patient Position: Sitting, Cuff Size: Normal)   Pulse 88   Resp 14   Ht 5' 3.75" (1.619 m)   Wt 104 lb (47.2 kg)   BMI 17.99 kg/m   Weight  change: @WEIGHTCHANGE @ Height:   Height: 5' 3.75" (161.9 cm)  Ht Readings from Last 3 Encounters:  03/29/17 5' 3.75" (1.619 m)  10/25/16 5' 3.5" (1.613 m)  04/25/16 5' 3.5" (1.613 m)    General appearance: alert, cooperative and appears stated age Head: Normocephalic, without obvious abnormality, atraumatic Neck: no adenopathy, supple, symmetrical, trachea midline and thyroid normal to inspection and palpation Lungs: clear to auscultation bilaterally Cardiovascular: regular rate and rhythm Breasts: stable pea sized mobile lump in the left breast at 7 o'clock. No skin changes. Abdomen: soft, non-tender; non distended,  no masses,  no organomegaly Extremities: extremities normal, atraumatic, no cyanosis or edema Skin: Skin color, texture, turgor normal. No rashes or lesions Lymph nodes: Cervical,  supraclavicular, and axillary nodes normal. No abnormal inguinal nodes palpated Neurologic: Grossly normal   Pelvic: External genitalia:  no lesions, slight erythema and mild agglutination. No whitening, no fissures, no plaques              Urethra:  normal appearing urethra with no masses, tenderness or lesions              Bartholins and Skenes: normal                 Vagina: normal appearing vagina with normal color. Slight increase in watery, white vaginal discharge, no lesions              Cervix: no lesions and IUD string 2 cm               Bimanual Exam:  Uterus:  normal size, contour, position, consistency, mobility, non-tender and anteverted              Adnexa: no mass, fullness, tenderness               Rectovaginal: Confirms               Anus:  normal sphincter tone, no lesions  Chaperone was present for exam.  A:  Well Woman with normal exam  Smoker, encouraged to quit  Breast lump stable, prior normal imaging  IUD check up  Mild vulvitis, small amount of d/c noted on exam.     P:   Screening labs  Gardasil information given and reviewed, she would like to get it, will check with her insurance  Discussed breast self exam  Discussed calcium and vit D intake  Screening std

## 2017-03-30 LAB — COMPREHENSIVE METABOLIC PANEL
A/G RATIO: 1.9 (ref 1.2–2.2)
ALT: 19 IU/L (ref 0–32)
AST: 23 IU/L (ref 0–40)
Albumin: 4.8 g/dL (ref 3.5–5.5)
Alkaline Phosphatase: 68 IU/L (ref 39–117)
BILIRUBIN TOTAL: 0.3 mg/dL (ref 0.0–1.2)
BUN/Creatinine Ratio: 19 (ref 9–23)
BUN: 13 mg/dL (ref 6–20)
CHLORIDE: 98 mmol/L (ref 96–106)
CO2: 25 mmol/L (ref 20–29)
Calcium: 9.7 mg/dL (ref 8.7–10.2)
Creatinine, Ser: 0.7 mg/dL (ref 0.57–1.00)
GFR calc Af Amer: 127 mL/min/{1.73_m2} (ref >59)
GFR calc non Af Amer: 110 mL/min/{1.73_m2} (ref >59)
GLUCOSE: 75 mg/dL (ref 65–99)
Globulin, Total: 2.5 g/dL (ref 1.5–4.5)
POTASSIUM: 4.1 mmol/L (ref 3.5–5.2)
Sodium: 137 mmol/L (ref 134–144)
Total Protein: 7.3 g/dL (ref 6.0–8.5)

## 2017-03-30 LAB — VAGINITIS/VAGINOSIS, DNA PROBE
CANDIDA SPECIES: NEGATIVE
GARDNERELLA VAGINALIS: NEGATIVE
Trichomonas vaginosis: NEGATIVE

## 2017-03-30 LAB — CBC
Hematocrit: 41.8 % (ref 34.0–46.6)
Hemoglobin: 13.3 g/dL (ref 11.1–15.9)
MCH: 30.7 pg (ref 26.6–33.0)
MCHC: 31.8 g/dL (ref 31.5–35.7)
MCV: 97 fL (ref 79–97)
PLATELETS: 400 10*3/uL — AB (ref 150–379)
RBC: 4.33 x10E6/uL (ref 3.77–5.28)
RDW: 12.9 % (ref 12.3–15.4)
WBC: 8 10*3/uL (ref 3.4–10.8)

## 2017-03-30 LAB — GC/CHLAMYDIA PROBE AMP
Chlamydia trachomatis, NAA: NEGATIVE
Neisseria gonorrhoeae by PCR: NEGATIVE

## 2017-03-30 LAB — LIPID PANEL
CHOLESTEROL TOTAL: 153 mg/dL (ref 100–199)
Chol/HDL Ratio: 2.1 ratio (ref 0.0–4.4)
HDL: 72 mg/dL (ref >39)
LDL Calculated: 61 mg/dL (ref 0–99)
TRIGLYCERIDES: 101 mg/dL (ref 0–149)
VLDL Cholesterol Cal: 20 mg/dL (ref 5–40)

## 2017-03-30 LAB — HEP, RPR, HIV PANEL
HIV Screen 4th Generation wRfx: NONREACTIVE
Hepatitis B Surface Ag: NEGATIVE
RPR Ser Ql: NONREACTIVE

## 2017-03-30 LAB — HEPATITIS C ANTIBODY

## 2017-04-25 ENCOUNTER — Ambulatory Visit: Payer: 59 | Admitting: Family Medicine

## 2017-05-01 ENCOUNTER — Other Ambulatory Visit: Payer: Self-pay | Admitting: Family Medicine

## 2017-05-01 ENCOUNTER — Ambulatory Visit: Payer: 59 | Admitting: Family Medicine

## 2017-05-01 DIAGNOSIS — F900 Attention-deficit hyperactivity disorder, predominantly inattentive type: Secondary | ICD-10-CM

## 2017-05-01 NOTE — Telephone Encounter (Signed)
Request for refill of Adderall, last filled on 03/24/17.  LOV: 10/25/16  PCP: Dr. Durene Cal  [facility]   [address]

## 2017-05-01 NOTE — Telephone Encounter (Signed)
Copied from CRM [id number]. Topic: Quick Communication - Rx Refill/Question >> May 01, 2017  1:04 PM Raquel Sarna wrote: amphetamine-dextroamphetamine (ADDERALL) 20 MG tablet  Pt needing a refill.   [id number] - [place], [place] - [address] AT [facility] OF [address] [place] [address] Phone: [phone] Fax: [phone]

## 2017-05-02 NOTE — Telephone Encounter (Signed)
See note

## 2017-05-02 NOTE — Telephone Encounter (Signed)
Pt calling to check status on her adderall being refilled. States she leaves town on Thursday.

## 2017-05-03 MED ORDER — AMPHETAMINE-DEXTROAMPHETAMINE 20 MG PO TABS: 20.0000 mg | ORAL_TABLET | Freq: Three times a day (TID) | ORAL | 0 refills | Status: DC

## 2017-05-03 NOTE — Telephone Encounter (Signed)
Pt had resch her appt on 05-18-17. Pt would like know if md could give her adderall until her appointment. Per pt she has not missed an appointment

## 2017-05-04 ENCOUNTER — Ambulatory Visit: Payer: 59 | Admitting: Family Medicine

## 2017-05-18 ENCOUNTER — Encounter: Payer: Self-pay | Admitting: Family Medicine

## 2017-05-18 ENCOUNTER — Ambulatory Visit: Payer: 59 | Admitting: Family Medicine

## 2017-05-18 VITALS — BP 124/86 | HR 91 | Temp 98.0°F | Ht 63.75 in | Wt 113.2 lb

## 2017-05-18 DIAGNOSIS — J301 Allergic rhinitis due to pollen: Secondary | ICD-10-CM

## 2017-05-18 DIAGNOSIS — F17200 Nicotine dependence, unspecified, uncomplicated: Secondary | ICD-10-CM | POA: Diagnosis not present

## 2017-05-18 DIAGNOSIS — F900 Attention-deficit hyperactivity disorder, predominantly inattentive type: Secondary | ICD-10-CM | POA: Diagnosis not present

## 2017-05-18 MED ORDER — AMPHETAMINE-DEXTROAMPHETAMINE 20 MG PO TABS: 20.0000 mg | ORAL_TABLET | Freq: Three times a day (TID) | ORAL | 0 refills | Status: DC

## 2017-05-18 NOTE — Patient Instructions (Addendum)
[employer] is a resource for quitting smoking. They have free nicotine replacement oftentimes.   We will give you handout for hypnotism- not cheap but may help  Could use 7 mg nicotine patch or  nicotine gum (max 5 a day)  Refilled meds for 3 months- let us know at least 2 weeks before you will run out and we can fill another 3 months- see [place] in 6 months

## 2017-05-18 NOTE — Assessment & Plan Note (Signed)
S: Adderall  TID working well . n oside effects A/P: Continue current medications-doing well -controlled substance contract 09/10/14.  - diagnosed in 6th grade- had been cared for by Dr. Lovell Sheehan and now by me -NCCSRS reviewed today- only rx through me

## 2017-05-18 NOTE — Progress Notes (Signed)
Subjective:  Hayley Miranda is a 37 y.o. year old very pleasant female patient who presents for/with See problem oriented charting ROS- no chest pain, shortness of breath, unintentional weight loss. No swelling or palpitations.    Past Medical History-  Patient Active Problem List   Diagnosis Date Noted  . CIGARETTE SMOKER 03/03/2008    Priority: High  . Attention deficit disorder 02/01/2007    Priority: Medium  . Onychomycosis 03/12/2015    Priority: Low  . 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 Allergic rhinitis 03/03/2008    Priority: Low    Medications- reviewed and updated Current Outpatient Medications  Medication Sig Dispense Refill  . amphetamine-dextroamphetamine (ADDERALL) 20 MG tablet Take 1 tablet (20 mg total) by mouth 3 (three) times daily. May fill in 2 months 90 tablet 0  . amphetamine-dextroamphetamine (ADDERALL) 20 MG tablet Take 1 tablet (20 mg total) by mouth 3 (three) times daily. May fill in 1 months 90 tablet 0  . amphetamine-dextroamphetamine (ADDERALL) 20 MG tablet Take 1 tablet (20 mg total) by mouth 3 (three) times daily. May fill today. 90 tablet 0  . levonorgestrel (MIRENA) 20 MCG/24HR IUD 1 each by Intrauterine route once.     No current facility-administered medications for this visit.     Objective: BP 124/86 (BP Location: Left Arm, Patient Position: Sitting, Cuff Size: Normal)   Pulse 91   Temp 98 F (36.7 C) (Oral)   Ht 5' 3.75" (1.619 m)   Wt 113 lb 3.2 oz (51.3 kg)   SpO2 98%   BMI 19.58 kg/m  Gen: NAD, resting comfortably, thin CV: RRR no murmurs rubs or gallops Lungs: CTAB no crackles, wheeze, rhonchi Abdomen: soft/nontender/nondistended Ext: no edema Skin: warm, dry  Assessment/Plan:  Attention deficit disorder S: Adderall  TID working well . n oside effects A/P: Continue current medications-doing well -controlled substance contract 09/10/14.  - diagnosed in  6th grade- had been cared for by Dr. Lovell Sheehan and now by me -NCCSRS reviewed today- only rx through me  CIGARETTE SMOKER S: smoking 5-6 a day.  A/P: Once again we discussed the importance of smoking cessation-I encouraged complete cessation.  I told patient if she continues to smoke by age 11 that we would stop prescribing ADD medications once again-this is primarily due to combined potential cardiac risk.  She understands my concerns.  Allergic rhinitis S:Did allegra for short period, Much better now off A/P: Continue as needed antihistamines  Future Appointments  Date Time Provider Department Center  11/21/2017  8:45 AM Shelva Majestic, MD [facility]  04/23/2018  1:15 PM Romualdo Bolk, MD [facility] None   Return in about 6 months (around 11/18/2017) for physical, if want to do labs- come fasting.  Lab/Order associations: Attention deficit hyperactivity disorder (ADHD), predominantly inattentive type - Plan:  Meds ordered this encounter  Medications  . amphetamine-dextroamphetamine (ADDERALL) 20 MG tablet    Sig: Take 1 tablet (20 mg total) by mouth 3 (three) times daily. May fill in 2 months    Dispense:  90 tablet    Refill:  0  . amphetamine-dextroamphetamine (ADDERALL) 20 MG tablet    Sig: Take 1 tablet (20 mg total) by mouth 3 (three) times daily. May fill in 1 months    Dispense:  90 tablet    Refill:  0  . amphetamine-dextroamphetamine (ADDERALL) 20 MG tablet    Sig: Take 1 tablet (20 mg total) by  mouth 3 (three) times daily. May fill today.    Dispense:  90 tablet    Refill:  0   Return precautions advised.  Tana Conch, MD

## 2017-05-18 NOTE — Assessment & Plan Note (Signed)
S: smoking 5-6 a day.  A/P: Once again we discussed the importance of smoking cessation-I encouraged complete cessation.  I told patient if she continues to smoke by age 37 that we would stop prescribing ADD medications once again-this is primarily due to combined potential cardiac risk.  She understands my concerns.

## 2017-05-18 NOTE — Assessment & Plan Note (Signed)
S:Did allegra for short period, Much better now off A/P: Continue as needed antihistamines

## 2017-08-11 ENCOUNTER — Other Ambulatory Visit: Payer: Self-pay

## 2017-08-11 ENCOUNTER — Telehealth: Payer: Self-pay | Admitting: Family Medicine

## 2017-08-11 DIAGNOSIS — F900 Attention-deficit hyperactivity disorder, predominantly inattentive type: Secondary | ICD-10-CM

## 2017-08-11 MED ORDER — AMPHETAMINE-DEXTROAMPHETAMINE 20 MG PO TABS: 20.0000 mg | ORAL_TABLET | Freq: Three times a day (TID) | ORAL | 0 refills | Status: DC

## 2017-08-11 NOTE — Telephone Encounter (Signed)
Copied from CRM [phone] [id number]. Topic: Quick Communication - Rx Refill/Question >> Aug 11, 2017  1:20 PM Jens SomMedley, Jennifer A wrote: Medication: amphetamine-dextroamphetamine (ADDERALL) 20 MG tablet    Has the patient contacted their pharmacy? Yes  (Agent: If no, request that the patient contact the pharmacy for the refill.) (Agent: If yes, when and what did the pharmacy advise?)  Preferred Pharmacy (with phone number or street name): [id number] - [place], [place] - [address] AT [facility] OF [address] [phone] (Phone) [phone] (Fax)    Agent: Please be advised that RX refills may take up to 3 business days. We ask that you follow-up with your pharmacy.

## 2017-08-11 NOTE — Telephone Encounter (Signed)
Prescriptions sent to Dr. Durene CalHunter to send in electronically

## 2017-08-11 NOTE — Telephone Encounter (Signed)
Adderall 20 mg refill Last Refill:05/18/17 # 90 Last OV: 05/18/17 PCP: Durene CalHunter Pharmacy:[facility] [phone]

## 2017-09-05 ENCOUNTER — Ambulatory Visit: Payer: 59 | Admitting: Obstetrics and Gynecology

## 2017-09-05 ENCOUNTER — Encounter: Payer: Self-pay | Admitting: Obstetrics and Gynecology

## 2017-09-05 ENCOUNTER — Other Ambulatory Visit: Payer: Self-pay

## 2017-09-05 VITALS — BP 132/74 | HR 88 | Resp 14 | Ht 63.5 in | Wt 111.4 lb

## 2017-09-05 DIAGNOSIS — N644 Mastodynia: Secondary | ICD-10-CM | POA: Diagnosis not present

## 2017-09-05 DIAGNOSIS — N3090 Cystitis, unspecified without hematuria: Secondary | ICD-10-CM | POA: Diagnosis not present

## 2017-09-05 DIAGNOSIS — R300 Dysuria: Secondary | ICD-10-CM | POA: Diagnosis not present

## 2017-09-05 LAB — POCT URINALYSIS DIPSTICK
Bilirubin, UA: NEGATIVE
Glucose, UA: NEGATIVE
Ketones, UA: NEGATIVE
LEUKOCYTES UA: NEGATIVE
Nitrite, UA: POSITIVE
Protein, UA: NEGATIVE
Urobilinogen, UA: 0.2 E.U./dL
pH, UA: 6 (ref 5.0–8.0)

## 2017-09-05 MED ORDER — NITROFURANTOIN MONOHYD MACRO 100 MG PO CAPS: 100.0000 mg | ORAL_CAPSULE | Freq: Two times a day (BID) | ORAL | 0 refills | Status: DC

## 2017-09-05 MED ORDER — PHENAZOPYRIDINE HCL 200 MG PO TABS: 200.0000 mg | ORAL_TABLET | Freq: Three times a day (TID) | ORAL | 0 refills | Status: DC | PRN

## 2017-09-05 NOTE — Progress Notes (Signed)
GYNECOLOGY  VISIT   HPI: 37 y.o.   Single  Caucasian  female   G1P1001 with No LMP recorded. (Menstrual status: IUD). here for   UTI symptoms x4 days  She originally started having symptoms of a UTI 2 weeks ago, tried OTC AZO, symptoms improved. Starting on Friday symptoms resumed. She c/o urinary frequency, urgency and dysuria.  She had some left over antibiotics from a tooth problem, she took her last 3 tablets yesterday. Feels a little better today, still hurts to void.  No fevers, she had some flank pain yesterday, better today. No vaginitis symptoms.  She also c/o pain in her left breast vs chest wall. She does work out daily, has pulled muscles in her chest previously. Now just tender to palpation, no baseline pain.   GYNECOLOGIC HISTORY: No LMP recorded. (Menstrual status: IUD). Contraception: IUD  Menopausal hormone therapy: none        OB History    Gravida  1   Para  1   Term  1   Preterm      AB      Living  1     SAB      TAB      Ectopic      Multiple      Live Births  1              Patient Active Problem List   Diagnosis Date Noted  . Onychomycosis 03/12/2015  . 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 CIGARETTE SMOKER 03/03/2008  . Allergic rhinitis 03/03/2008  . Attention deficit disorder 02/01/2007    Past Medical History:  Diagnosis Date  . Abnormal Pap smear of cervix   . Blood transfusion   . Blood transfusion without reported diagnosis   . CELLULITIS, FOOT, LEFT 09/16/2008  . Dermatophytosis of nail 02/01/2007       . STD (sexually transmitted disease) 06/2009   trichamonus    Past Surgical History:  Procedure Laterality Date  . COLPOSCOPY    . FOOT SURGERY Right 2010  . INTRAUTERINE DEVICE (IUD) INSERTION  03/2016   Mirena     Current Outpatient Medications  Medication Sig Dispense Refill  . amphetamine-dextroamphetamine (ADDERALL) 20 MG tablet Take 1 tablet (20 mg total) by  mouth 3 (three) times daily. May fill today. 90 tablet 0  . levonorgestrel (MIRENA) 20 MCG/24HR IUD 1 each by Intrauterine route once.     No current facility-administered medications for this visit.      ALLERGIES: Sulfamethoxazole-trimethoprim  Family History  Problem Relation Age of Onset  . Hyperlipidemia Father        fhx  . Hypertension Father        fhx  . Cancer Unknown        prostate/fhx  . Osteoporosis Mother   . Lung cancer Maternal Grandmother   . Lung cancer Paternal Grandmother   . Lung cancer Paternal Grandfather   . Liver cancer Maternal Grandfather     Social History   Socioeconomic History  . Marital status: Single    Spouse name: Not on file  . Number of children: Not on file  . Years of education: Not on file  . Highest education level: Not on file  Occupational History  . Not on file  Social Needs  . Financial resource strain: Not on file  . Food insecurity:    Worry: Not on file    Inability: Not on file  .  Transportation needs:    Medical: Not on file    Non-medical: Not on file  Tobacco Use  . Smoking status: Current Every Day Smoker    Packs/day: 0.50    Types: Cigarettes  . Smokeless tobacco: Never Used  Substance and Sexual Activity  . Alcohol use: Yes    Alcohol/week: 6.0 standard drinks    Types: 6 Standard drinks or equivalent per week  . Drug use: No  . Sexual activity: Yes    Partners: Male    Birth control/protection: IUD  Lifestyle  . Physical activity:    Days per week: Not on file    Minutes per session: Not on file  . Stress: Not on file  Relationships  . Social connections:    Talks on phone: Not on file    Gets together: Not on file    Attends religious service: Not on file    Active member of club or organization: Not on file    Attends meetings of clubs or organizations: Not on file    Relationship status: Not on file  . Intimate partner violence:    Fear of current or ex partner: Not on file    Emotionally  abused: Not on file    Physically abused: Not on file    Forced sexual activity: Not on file  Other Topics Concern  . Not on file  Social History Narrative  . Not on file    Review of Systems  Constitutional: Negative.   HENT: Negative.   Eyes: Negative.   Respiratory: Negative.   Cardiovascular: Negative.   Gastrointestinal: Negative.   Genitourinary: Positive for dysuria, frequency and urgency.  Musculoskeletal: Negative.   Skin: Negative.   Neurological: Negative.   Endo/Heme/Allergies: Negative.   Psychiatric/Behavioral: Negative.     PHYSICAL EXAMINATION:    BP 132/74 (BP Location: Right Arm, Patient Position: Sitting, Cuff Size: Normal)   Pulse 88   Resp 14   Ht 5' 3.5" (1.613 m)   Wt 111 lb 6.4 oz (50.5 kg)   BMI 19.42 kg/m     General appearance: alert, cooperative and appears stated age Breasts: normal appearance, no masses, tender on the mid lower portion of the left breast vs chest wall. Suspect it is her chest wall  No axillary adenopathy Abdomen: soft, non-tender; non distended, no masses,  no organomegaly CVA: not tender    Urine dip: +nitrate, trace blood, otherwise negative  ASSESSMENT UTI Chest wall pain vs mastalgia, normal exam (other than tenderness)    PLAN Macrobid and pyridium Send urine for ua, c&s Discussed treatment for chest wall pain, information given  Information given on breast pain Call with fevers, flank pain or if her chest/breast pain doesn't resolve in the next few weeks   An After Visit Summary was printed and given to the patient.

## 2017-09-05 NOTE — Patient Instructions (Signed)
To try and decrease your breast pain, you should have a well fitting supportive bra, cut back on caffeine, and use ice or heat as needed. Some women find relief with the supplement evening primrose oil.   Urinary Tract Infection, Adult A urinary tract infection (UTI) is an infection of any part of the urinary tract. The urinary tract includes the: Kidneys. Ureters. Bladder. Urethra.  These organs make, store, and get rid of pee (urine) in the body. Follow these instructions at home: Take over-the-counter and prescription medicines only as told by your doctor. If you were prescribed an antibiotic medicine, take it as told by your doctor. Do not stop taking the antibiotic even if you start to feel better. Avoid the following drinks: Alcohol. Caffeine. Tea. Carbonated drinks. Drink enough fluid to keep your pee clear or pale yellow. Keep all follow-up visits as told by your doctor. This is important. Make sure to: Empty your bladder often and completely. Do not to hold pee for long periods of time. Empty your bladder before and after sex. Wipe from front to back after a bowel movement if you are female. Use each tissue one time when you wipe. Contact a doctor if: You have back pain. You have a fever. You feel sick to your stomach (nauseous). You throw up (vomit). Your symptoms do not get better after 3 days. Your symptoms go away and then come back. Get help right away if: You have very bad back pain. You have very bad lower belly (abdominal) pain. You are throwing up and cannot keep down any medicines or water. This information is not intended to replace advice given to you by your health care provider. Make sure you discuss any questions you have with your health care provider. Document Released: 06/08/2007 Document Revised: 05/28/2015 Document Reviewed: 11/10/2014 Elsevier Interactive Patient Education  2018 [employer]vier Inc.  Chest Wall Pain Chest wall pain is pain in or around  the bones and muscles of your chest. Sometimes, an injury causes this pain. Sometimes, the cause may not be known. This pain may take several weeks or longer to get better. Follow these instructions at home: Pay attention to any changes in your symptoms. Take these actions to help with your pain:  Rest as told by your health care provider.  Avoid activities that cause pain. These include any activities that use your chest muscles or your abdominal and side muscles to lift heavy items.  If directed, apply ice to the painful area: ? Put ice in a plastic bag. ? Place a towel between your skin and the bag. ? Leave the ice on for 20 minutes, 2-3 times per day.  Take over-the-counter and prescription medicines only as told by your health care provider.  Do not use tobacco products, including cigarettes, chewing tobacco, and e-cigarettes. If you need help quitting, ask your health care provider.  Keep all follow-up visits as told by your health care provider. This is important.  Contact a health care provider if:  You have a fever.  Your chest pain becomes worse.  You have new symptoms. Get help right away if:  You have nausea or vomiting.  You feel sweaty or light-headed.  You have a cough with phlegm (sputum) or you cough up blood.  You develop shortness of breath. This information is not intended to replace advice given to you by your health care provider. Make sure you discuss any questions you have with your health care provider. Document Released: 12/20/2004 Document Revised:  04/30/2015 Document Reviewed: 03/17/2014 Elsevier Interactive Patient Education  [employer].

## 2017-09-06 LAB — URINE CULTURE: ORGANISM ID, BACTERIA: NO GROWTH

## 2017-09-06 LAB — URINALYSIS, MICROSCOPIC ONLY

## 2017-09-07 ENCOUNTER — Telehealth: Payer: Self-pay | Admitting: Obstetrics and Gynecology

## 2017-09-07 ENCOUNTER — Telehealth: Payer: Self-pay

## 2017-09-07 NOTE — Telephone Encounter (Signed)
-----   Message from Romualdo Bolk, MD sent at 09/07/2017 10:34 AM EDT ----- Please let the patient know that her urine culture was negative for infection. Since she self treated with some left over antibiotics, I would recommend that she finish the course of antibiotics that she was prescribed this week. If she isn't feeling better after finishing the treatment, she should call back.

## 2017-09-07 NOTE — Telephone Encounter (Signed)
Patient left voicemail returning call to [facility] regarding results.

## 2017-09-07 NOTE — Telephone Encounter (Signed)
Left message informing patient or results and recommendations.

## 2017-09-07 NOTE — Telephone Encounter (Signed)
Tried to contact patient, unable to reach, left message.

## 2017-09-27 ENCOUNTER — Telehealth: Payer: Self-pay

## 2017-09-27 NOTE — Telephone Encounter (Signed)
Copied from CRM [id number]. Topic: Quick Communication - Rx Refill/Question >> Sep 27, 2017  1:19 PM Percival Spanish wrote: Medication   amphetamine-dextroamphetamine (ADDERALL) 20 MG tablet  Preferred Pharmacy  [facility]   Agent: Please be advised that RX refills may take up to 3 business days. We ask that you follow-up with your pharmacy.

## 2017-10-02 ENCOUNTER — Other Ambulatory Visit: Payer: Self-pay

## 2017-10-02 DIAGNOSIS — F900 Attention-deficit hyperactivity disorder, predominantly inattentive type: Secondary | ICD-10-CM

## 2017-10-02 NOTE — Telephone Encounter (Signed)
Refill request sent to Dr. Durene Cal to fill electronically

## 2017-10-03 MED ORDER — AMPHETAMINE-DEXTROAMPHETAMINE 20 MG PO TABS: 20.0000 mg | ORAL_TABLET | Freq: Every day | ORAL | 0 refills | Status: DC

## 2017-10-03 MED ORDER — AMPHETAMINE-DEXTROAMPHETAMINE 20 MG PO TABS: 20.0000 mg | ORAL_TABLET | Freq: Three times a day (TID) | ORAL | 0 refills | Status: DC

## 2017-11-21 ENCOUNTER — Encounter: Payer: Self-pay | Admitting: Family Medicine

## 2017-11-21 ENCOUNTER — Ambulatory Visit (INDEPENDENT_AMBULATORY_CARE_PROVIDER_SITE_OTHER): Payer: 59 | Admitting: Family Medicine

## 2017-11-21 VITALS — BP 126/70 | HR 87 | Temp 98.0°F | Ht 63.5 in | Wt 113.8 lb

## 2017-11-21 DIAGNOSIS — F988 Other specified behavioral and emotional disorders with onset usually occurring in childhood and adolescence: Secondary | ICD-10-CM | POA: Diagnosis not present

## 2017-11-21 DIAGNOSIS — F17200 Nicotine dependence, unspecified, uncomplicated: Secondary | ICD-10-CM | POA: Diagnosis not present

## 2017-11-21 DIAGNOSIS — Z0000 Encounter for general adult medical examination without abnormal findings: Secondary | ICD-10-CM | POA: Diagnosis not present

## 2017-11-21 DIAGNOSIS — J449 Chronic obstructive pulmonary disease, unspecified: Secondary | ICD-10-CM

## 2017-11-21 DIAGNOSIS — Z23 Encounter for immunization: Secondary | ICD-10-CM

## 2017-11-21 DIAGNOSIS — J4489 Other specified chronic obstructive pulmonary disease: Secondary | ICD-10-CM

## 2017-11-21 DIAGNOSIS — Z79899 Other long term (current) drug therapy: Secondary | ICD-10-CM

## 2017-11-21 LAB — URINALYSIS
Bilirubin Urine: NEGATIVE
Hgb urine dipstick: NEGATIVE
Ketones, ur: NEGATIVE
Leukocytes, UA: NEGATIVE
Nitrite: NEGATIVE
PH: 8 (ref 5.0–8.0)
SPECIFIC GRAVITY, URINE: 1.01 (ref 1.000–1.030)
TOTAL PROTEIN, URINE-UPE24: NEGATIVE
URINE GLUCOSE: NEGATIVE
Urobilinogen, UA: 0.2 (ref 0.0–1.0)

## 2017-11-21 LAB — COMPREHENSIVE METABOLIC PANEL
ALT: 15 U/L (ref 0–35)
AST: 20 U/L (ref 0–37)
Albumin: 4.4 g/dL (ref 3.5–5.2)
Alkaline Phosphatase: 60 U/L (ref 39–117)
BUN: 10 mg/dL (ref 6–23)
CHLORIDE: 102 meq/L (ref 96–112)
CO2: 29 meq/L (ref 19–32)
CREATININE: 0.72 mg/dL (ref 0.40–1.20)
Calcium: 9.4 mg/dL (ref 8.4–10.5)
GFR: 95.73 mL/min (ref >60.00)
GLUCOSE: 57 mg/dL — AB (ref 70–99)
Potassium: 4 mEq/L (ref 3.5–5.1)
Sodium: 138 mEq/L (ref 135–145)
Total Bilirubin: 0.7 mg/dL (ref 0.2–1.2)
Total Protein: 6.7 g/dL (ref 6.0–8.3)

## 2017-11-21 LAB — CBC
HCT: 40.1 % (ref 36.0–46.0)
Hemoglobin: 13.6 g/dL (ref 12.0–15.0)
MCHC: 33.9 g/dL (ref 30.0–36.0)
MCV: 92.2 fl (ref 78.0–100.0)
Platelets: 370 10*3/uL (ref 150.0–400.0)
RBC: 4.35 Mil/uL (ref 3.87–5.11)
RDW: 12.4 % (ref 11.5–15.5)
WBC: 5.7 10*3/uL (ref 4.0–10.5)

## 2017-11-21 MED ORDER — AMPHETAMINE-DEXTROAMPHETAMINE 20 MG PO TABS: 20.0000 mg | ORAL_TABLET | Freq: Three times a day (TID) | ORAL | 0 refills | Status: DC

## 2017-11-21 MED ORDER — PREDNISONE 20 MG PO TABS: ORAL_TABLET | ORAL | 0 refills | Status: DC

## 2017-11-21 MED ORDER — AMPHETAMINE-DEXTROAMPHETAMINE 20 MG PO TABS: 20.0000 mg | ORAL_TABLET | Freq: Every day | ORAL | 0 refills | Status: DC

## 2017-11-21 NOTE — Addendum Note (Signed)
Addended by: Olevia BowensZELLMER, JENNIFER M on: 11/21/2017 10:16 AM   Modules accepted: Orders

## 2017-11-21 NOTE — Patient Instructions (Addendum)
Health Maintenance Due  Topic Date Due  . INFLUENZA VACCINE-thanks for doing your flu shot today 08/03/2017   -Can reach out in 3 months and we will refill another 3 months for ADD meds.  Needs to see us in 6 months.  -We are giving her a course of prednisone to use if she fails to improve within the next few days in regards to her left ear pressure or sinus pressure-if she has worsening symptoms despite that or has new symptoms such as fever or shortness of breath should see us back immediately

## 2017-11-21 NOTE — Progress Notes (Signed)
Phone: [phone]  Subjective:  Patient presents today for their annual physical. Chief complaint-noted.   See problem oriented charting- ROS- full  review of systems was completed and negative except for: left ear pressure, some sinus pressure, minimal cough- not able to get much out of nose  The following were reviewed and entered/updated in epic: Past Medical History:  Diagnosis Date  . Abnormal Pap smear of cervix   . Blood transfusion   . Blood transfusion without reported diagnosis   . CELLULITIS, FOOT, LEFT 09/16/2008  . Dermatophytosis of nail 02/01/2007       . STD (sexually transmitted disease) 06/2009   trichamonus   Patient Active Problem List   Diagnosis Date Noted  . CIGARETTE SMOKER 03/03/2008    Priority: High  . Attention deficit disorder (ADD) in adult 02/01/2007    Priority: Medium  . Onychomycosis 03/12/2015    Priority: Low  . 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 Allergic rhinitis 03/03/2008    Priority: Low   Past Surgical History:  Procedure Laterality Date  . COLPOSCOPY    . FOOT SURGERY Right 2010  . INTRAUTERINE DEVICE (IUD) INSERTION  03/2016   Mirena     Family History  Problem Relation Age of Onset  . Hyperlipidemia Father        fhx  . Hypertension Father        fhx  . Cancer Unknown        prostate/fhx  . Osteoporosis Mother   . Lung cancer Maternal Grandmother   . Lung cancer Paternal Grandmother   . Lung cancer Paternal Grandfather   . Liver cancer Maternal Grandfather     Medications- reviewed and updated Current Outpatient Medications  Medication Sig Dispense Refill  . amphetamine-dextroamphetamine (ADDERALL) 20 MG tablet Take 1 tablet (20 mg total) by mouth 3 (three) times daily. May fill today. 90 tablet 0  . amphetamine-dextroamphetamine (ADDERALL) 20 MG tablet Take 1 tablet (20 mg total) by mouth daily. Fill in 1 month 90 tablet 0  . levonorgestrel  (MIRENA) 20 MCG/24HR IUD 1 each by Intrauterine route once.      Allergies-reviewed and updated Allergies  Allergen Reactions  . Sulfamethoxazole-Trimethoprim     REACTION: rash--fever    Social History   Social History Narrative   Divorced-currently dating.  Has 1 daughter from prior marriage    Objective: BP 126/70 (BP Location: Left Arm, Patient Position: Sitting, Cuff Size: Normal)   Pulse 87   Temp 98 F (36.7 C) (Oral)   Ht 5' 3.5" (1.613 m)   Wt 113 lb 12.8 oz (51.6 kg)   LMP  (LMP Unknown)   SpO2 98%   BMI 19.84 kg/m  Gen: NAD, resting comfortably HEENT: Mucous membranes are moist. Oropharynx normal Neck: no thyromegaly CV: RRR no murmurs rubs or gallops Lungs: CTAB no crackles, wheeze, rhonchi Abdomen: soft/nontender/nondistended/normal bowel sounds. No rebound or guarding.  Ext: no edema Skin: warm, dry Neuro: grossly normal, moves all extremities, PERRLA  Assessment/Plan:  37 y.o. female presenting for annual physical.  Health Maintenance counseling: 1. Anticipatory guidance: Patient counseled regarding regular dental exams -q6 months, eye exams -no issues,  avoiding smoking and second hand smoke, limiting alcohol to 1 beverage per day.   2. Risk factor reduction:  Advised patient of need for regular exercise and diet rich and fruits and vegetables to reduce risk of heart attack and stroke. Exercise- daily basis. Diet-somewhat low on  veggies but otherwise pretty reasonable.  Wt Readings from Last 3 Encounters:  11/21/17 113 lb 12.8 oz (51.6 kg)  09/05/17 111 lb 6.4 oz (50.5 kg)  05/18/17 113 lb 3.2 oz (51.3 kg)  3. Immunizations/screenings/ancillary studies-flu shot today  Immunization History  Administered Date(s) Administered  . Influenza Whole 11/02/2009  . Influenza,inj,Quad PF,6+ Mos 10/08/2012, 09/18/2013, 09/10/2014, 10/09/2015, 10/25/2016  . Td 02/01/2007  . Tdap 06/10/2015  4. Cervical cancer screening- sees Dr. Oscar La with last Pap 03/28/2016  with negative HPV with notes for 3-year repeat at shortest interval- looks like they may space to 5 years.  5. Breast cancer screening-  breast exam with gynecology and mammogram-she is already doing these yearly 6. Colon cancer screening - no family history, start at age 64 7. Skin cancer screening- no dermatologist. advised regular sunscreen use. Denies worrisome, changing, or new skin lesions.  8. Birth control/STD check- Mirena IUD-uses protection. Screened in march due to new partner- no new partners since that time.  9.  Current smoker- we will get urinalysis.  Strongly advised cessation-patient not ready to quit yet.  We have discussed if she is unable to quit by age 88 considering stopping ADD medications given potential combined cardiac risks. She is planning to quit by 38 or sooner- she is down to 3-4 per day.   Status of chronic or acute concerns   COPD S: Patient was diagnosed by her prior physician-this was a presumptive diagnosis due to chronic cough in the past. A/P: Stable/no worsening symptoms-encouraged cessation from smoking to prevent progression.  Allergies - takes allegra D from time to time - 5 days of mild left ear pressure and sinus pressure - did do big clean up in warehouse and around a lot of carpet/dust  -We are giving her a course of prednisone to use if she fails to improve within the next few days in regards to her left ear pressure or sinus pressure-if she has worsening symptoms despite that or has new symptoms such as fever or shortness of breath should see [place] back immediately  Attention deficit disorder (ADD) in adult ADD adult -Contract on 09/10/2014 - Diagnosed in the 6th grade. -Multiple medication failures but has done well on Adderall 20 mg 3 times daily - UDS on 10/25/16-need to update today - Continue Adderall 20 mg 3 times daily-3 months of refills provided today -Can reach out in 3 months and we will refill another 3 months.  Needs to see [place] in 6  months.   Future Appointments  Date Time Provider Department Center  04/23/2018  1:30 PM Romualdo Bolk, MD [facility] None   Return in about 6 months (around 05/22/2018) for follow up- or sooner if needed.  Lab/Order associations: Preventative health care - Plan: CBC, Comprehensive metabolic panel, Urinalysis, Pain Mgmt, Profile 8 w/Conf, U  High risk medication use - Plan: CBC, Comprehensive metabolic panel, Urinalysis, Pain Mgmt, Profile 8 w/Conf, U  Attention deficit disorder (ADD) in adult - Plan: CBC, Comprehensive metabolic panel, Pain Mgmt, Profile 8 w/Conf, U  CIGARETTE SMOKER - Plan: Urinalysis  COPD (chronic obstructive pulmonary disease) with chronic bronchitis (HCC)  Meds ordered this encounter  Medications  . predniSONE (DELTASONE) 20 MG tablet    Sig: Take 1 tablet by mouth daily for 5 days, then 1/2 tablet daily for 2 days    Dispense:  6 tablet    Refill:  0  . amphetamine-dextroamphetamine (ADDERALL) 20 MG tablet    Sig: Take 1 tablet (20  mg total) by mouth 3 (three) times daily. May fill today.    Dispense:  90 tablet    Refill:  0  . amphetamine-dextroamphetamine (ADDERALL) 20 MG tablet    Sig: Take 1 tablet (20 mg total) by mouth daily. Fill in 1 month    Dispense:  90 tablet    Refill:  0  . amphetamine-dextroamphetamine (ADDERALL) 20 MG tablet    Sig: Take 1 tablet (20 mg total) by mouth 3 (three) times daily. May fill in 2 months    Dispense:  90 tablet    Refill:  0    Return precautions advised.  Tana Conch, MD

## 2017-11-21 NOTE — Assessment & Plan Note (Signed)
ADD adult -Contract on 09/10/2014 - Diagnosed in the 6th grade. -Multiple medication failures but has done well on Adderall 20 mg 3 times daily - UDS on 10/25/16-need to update today - Continue Adderall 20 mg 3 times daily-3 months of refills provided today -Can reach out in 3 months and we will refill another 3 months.  Needs to see us in 6 months.

## 2017-11-24 LAB — PAIN MGMT, PROFILE 8 W/CONF, U
6 Acetylmorphine: NEGATIVE ng/mL (ref <10)
AMPHETAMINE: 3595 ng/mL — AB (ref <250)
Alcohol Metabolites: POSITIVE ng/mL — AB (ref <500)
Amphetamines: POSITIVE ng/mL — AB (ref <500)
BUPRENORPHINE, URINE: NEGATIVE ng/mL (ref <5)
Benzodiazepines: NEGATIVE ng/mL (ref <100)
CREATININE: 131.4 mg/dL
Cocaine Metabolite: NEGATIVE ng/mL (ref <150)
Ethyl Glucuronide (ETG): 14133 ng/mL — ABNORMAL HIGH (ref <500)
Ethyl Sulfate (ETS): 4704 ng/mL — ABNORMAL HIGH (ref <100)
MARIJUANA METABOLITE: NEGATIVE ng/mL (ref <20)
MDMA: NEGATIVE ng/mL (ref <500)
Methamphetamine: NEGATIVE ng/mL (ref <250)
OPIATES: NEGATIVE ng/mL (ref <100)
OXIDANT: NEGATIVE ug/mL (ref <200)
Oxycodone: NEGATIVE ng/mL (ref <100)
PH: 7.27 (ref 4.5–9.0)

## 2018-01-22 ENCOUNTER — Other Ambulatory Visit: Payer: Self-pay

## 2018-01-22 ENCOUNTER — Telehealth: Payer: Self-pay | Admitting: Family Medicine

## 2018-01-22 MED ORDER — AMPHETAMINE-DEXTROAMPHETAMINE 20 MG PO TABS: 20.0000 mg | ORAL_TABLET | Freq: Three times a day (TID) | ORAL | 0 refills | Status: DC

## 2018-01-22 MED ORDER — AMPHETAMINE-DEXTROAMPHETAMINE 20 MG PO TABS: 20.0000 mg | ORAL_TABLET | Freq: Every day | ORAL | 0 refills | Status: DC

## 2018-01-22 NOTE — Telephone Encounter (Signed)
See note

## 2018-01-22 NOTE — Telephone Encounter (Signed)
Refills sent to Dr. Durene Cal to escribe to pharmacy

## 2018-01-22 NOTE — Telephone Encounter (Signed)
Copied from CRM [id number]. Topic: Quick Communication - Rx Refill/Question >> Jan 22, 2018  2:05 PM Marylen Ponto wrote: Medication: amphetamine-dextroamphetamine (ADDERALL) 20 MG tablet  Has the patient contacted their pharmacy? yes   Preferred Pharmacy (with phone number or street name): [id number] - [place], [place] - [address] AT [facility] OF [address] & [address] [phone] (Phone) [phone] (Fax)  Agent: Please be advised that RX refills may take up to 3 business days. We ask that you follow-up with your pharmacy.

## 2018-02-22 NOTE — Telephone Encounter (Signed)
Yes thanks-May provide verbal order

## 2018-02-22 NOTE — Telephone Encounter (Signed)
See note

## 2018-02-22 NOTE — Telephone Encounter (Addendum)
Caller name: Romeo Apple  Relation to pt: pharmacist Tech  Call back number: [phone]  Pharmacy: [id number] - [place], [place] - [address] AT [facility] OF [address] [place] [address]  Phone: [phone] Fax: [phone]  Open 24 hours      Reason for call:  Pharmacist called stating Feb amphetamine-dextroamphetamine (ADDERALL) 20 MG tablet prescription reflects 1x daily and it should reflect 3 daily, requesting verbal orders

## 2018-02-22 NOTE — Telephone Encounter (Signed)
Okay for verbal order Please advise

## 2018-02-23 ENCOUNTER — Telehealth: Payer: Self-pay | Admitting: Family Medicine

## 2018-02-23 NOTE — Telephone Encounter (Signed)
Copied from CRM [id number]. Topic: General - Other >> Feb 23, 2018  9:57 AM Leafy Ro wrote: Reason for CRM: pt rx for adderall 20 mg the second rx is not correct. Pharm received 3 rx on 01/22/2018. Pt takes generic adderall 20 mg three times a day not once a day. [facility]

## 2018-02-23 NOTE — Telephone Encounter (Signed)
See note

## 2018-02-23 NOTE — Telephone Encounter (Signed)
Pharmacy Tech given vo. No further action needed!

## 2018-02-23 NOTE — Telephone Encounter (Signed)
Called pt and informed her this has been taking care of. Pt stated that she just got a call that it was ready for pickup. No further action needed!

## 2018-04-23 ENCOUNTER — Ambulatory Visit: Payer: 59 | Admitting: Obstetrics and Gynecology

## 2018-04-24 ENCOUNTER — Other Ambulatory Visit: Payer: Self-pay | Admitting: Family Medicine

## 2018-04-24 MED ORDER — AMPHETAMINE-DEXTROAMPHETAMINE 20 MG PO TABS: 20.0000 mg | ORAL_TABLET | Freq: Three times a day (TID) | ORAL | 0 refills | Status: DC

## 2018-04-24 NOTE — Telephone Encounter (Signed)
Requested medication (s) are due for refill today: yes  Requested medication (s) are on the active medication list: yes  Last refill:  01/22/18  Future visit scheduled: yes  Notes to clinic:  medication not delegated to NT to refill   Requested Prescriptions  Pending Prescriptions Disp Refills   amphetamine-dextroamphetamine (ADDERALL) 20 MG tablet 90 tablet 0    Sig: Take 1 tablet (20 mg total) by mouth 3 (three) times daily. May fill today.     Not Delegated - Psychiatry:  Stimulants/ADHD Failed - 04/24/2018 10:20 AM      Failed - This refill cannot be delegated      Failed - Valid encounter within last 3 months    Recent Outpatient Visits          5 months ago Preventative health care   [facility] [place], Aldine Contes, MD   11 months ago Attention deficit hyperactivity disorder (ADHD), predominantly inattentive type   [facility], Aldine Contes, MD   1 year ago Attention deficit hyperactivity disorder (ADHD), predominantly inattentive type   [facility], Aldine Contes, MD   1 year ago Attention deficit hyperactivity disorder (ADHD), predominantly inattentive type   [facility] [employer], Aldine Contes, MD   2 years ago Attention deficit hyperactivity disorder (ADHD), predominantly inattentive type   [facility] [employer], Aldine Contes, MD      Future Appointments            In 1 month Durene Cal, Aldine Contes, MD [facility] [place], [facility]           Passed - Urine Drug Screen completed in last 360 days.

## 2018-04-24 NOTE — Telephone Encounter (Signed)
Last OV 11/21/17 Last refill 01/22/18 #90/0 Next OV 05/24/18  Forwarding to Dr. Durene Cal

## 2018-04-24 NOTE — Telephone Encounter (Signed)
Schedule doxy appt for refill, not seen in last 3 months

## 2018-05-21 NOTE — Progress Notes (Deleted)
38 y.o. G1P1001 Single White or Caucasian Not Hispanic or Latino female here for annual exam.      No LMP recorded. (Menstrual status: IUD).          Sexually active: {yes no:314532}  The current method of family planning is {contraception:315051}.    Exercising: {yes no:314532}  {types:19826} Smoker:  {YES NO:22349}  Health Maintenance: Pap:  03-28-16 WNL NEG HR HPV, 03-26-15 WNL NEG HR HPV  History of abnormal Pap:  Yes 01-19-11 ASCUS, HX CIN I Colpo BX 2010  MMG:  04-01-15 WNL  TDaP:  06-10-15 Gardasil: no    reports that she has been smoking cigarettes. She has been smoking about 0.50 packs per day. She has never used smokeless tobacco. She reports current alcohol use of about 6.0 standard drinks of alcohol per week. She reports that she does not use drugs.  Past Medical History:  Diagnosis Date  . Abnormal Pap smear of cervix   . Blood transfusion   . Blood transfusion without reported diagnosis   . CELLULITIS, FOOT, LEFT 09/16/2008  . Dermatophytosis of nail 02/01/2007       . STD (sexually transmitted disease) 06/2009   trichamonus    Past Surgical History:  Procedure Laterality Date  . COLPOSCOPY    . FOOT SURGERY Right 2010  . INTRAUTERINE DEVICE (IUD) INSERTION  03/2016   Mirena     Current Outpatient Medications  Medication Sig Dispense Refill  . amphetamine-dextroamphetamine (ADDERALL) 20 MG tablet Take 1 tablet (20 mg total) by mouth 3 (three) times daily. May fill today. 90 tablet 0  . levonorgestrel (MIRENA) 20 MCG/24HR IUD 1 each by Intrauterine route once.    . predniSONE (DELTASONE) 20 MG tablet Take 1 tablet by mouth daily for 5 days, then 1/2 tablet daily for 2 days 6 tablet 0   No current facility-administered medications for this visit.     Family History  Problem Relation Age of Onset  . Hyperlipidemia Father        fhx  . Hypertension Father        fhx  . Cancer Unknown        prostate/fhx  . Osteoporosis Mother   . Lung cancer Maternal  Grandmother   . Lung cancer Paternal Grandmother   . Lung cancer Paternal Grandfather   . Liver cancer Maternal Grandfather     Review of Systems  Exam:   There were no vitals taken for this visit.  Weight change: @WEIGHTCHANGE @ Height:      Ht Readings from Last 3 Encounters:  11/21/17 5' 3.5" (1.613 m)  09/05/17 5' 3.5" (1.613 m)  05/18/17 5' 3.75" (1.619 m)    General appearance: alert, cooperative and appears stated age Head: Normocephalic, without obvious abnormality, atraumatic Neck: no adenopathy, supple, symmetrical, trachea midline and thyroid {CHL AMB PHY EX THYROID NORM DEFAULT:[phone]::"normal to inspection and palpation"} Lungs: clear to auscultation bilaterally Cardiovascular: regular rate and rhythm Breasts: {Exam; breast:13139::"normal appearance, no masses or tenderness"} Abdomen: soft, non-tender; non distended,  no masses,  no organomegaly Extremities: extremities normal, atraumatic, no cyanosis or edema Skin: Skin color, texture, turgor normal. No rashes or lesions Lymph nodes: Cervical, supraclavicular, and axillary nodes normal. No abnormal inguinal nodes palpated Neurologic: Grossly normal   Pelvic: External genitalia:  no lesions              Urethra:  normal appearing urethra with no masses, tenderness or lesions  Bartholins and Skenes: normal                 Vagina: normal appearing vagina with normal color and discharge, no lesions              Cervix: {CHL AMB PHY EX CERVIX NORM DEFAULT:[phone]::"no lesions"}               Bimanual Exam:  Uterus:  {CHL AMB PHY EX UTERUS NORM [phone]::"normal size, contour, position, consistency, mobility, non-tender"}              Adnexa: {CHL AMB PHY EX ADNEXA NO MASS DEFAULT:[phone]::"no mass, fullness, tenderness"}               Rectovaginal: Confirms               Anus:  normal sphincter tone, no lesions  Chaperone was present for exam.  A:  Well Woman with normal exam  P:

## 2018-05-22 ENCOUNTER — Ambulatory Visit: Payer: 59 | Admitting: Obstetrics and Gynecology

## 2018-05-22 ENCOUNTER — Other Ambulatory Visit: Payer: Self-pay

## 2018-05-22 ENCOUNTER — Encounter: Payer: Self-pay | Admitting: Obstetrics and Gynecology

## 2018-05-22 VITALS — BP 120/78 | HR 64 | Temp 98.5°F | Wt 115.0 lb

## 2018-05-22 DIAGNOSIS — N3090 Cystitis, unspecified without hematuria: Secondary | ICD-10-CM | POA: Diagnosis not present

## 2018-05-22 MED ORDER — NITROFURANTOIN MONOHYD MACRO 100 MG PO CAPS: 100.0000 mg | ORAL_CAPSULE | Freq: Two times a day (BID) | ORAL | 0 refills | Status: DC

## 2018-05-22 MED ORDER — PHENAZOPYRIDINE HCL 200 MG PO TABS: 200.0000 mg | ORAL_TABLET | Freq: Three times a day (TID) | ORAL | 0 refills | Status: DC | PRN

## 2018-05-22 NOTE — Patient Instructions (Signed)

## 2018-05-22 NOTE — Addendum Note (Signed)
Addended by: Tobi Bastos on: 05/22/2018 03:48 PM   Modules accepted: Orders

## 2018-05-22 NOTE — Addendum Note (Signed)
Addended by: Ginny Forth on: 05/22/2018 03:49 PM   Modules accepted: Orders

## 2018-05-22 NOTE — Progress Notes (Signed)
GYNECOLOGY  VISIT   HPI: 38 y.o.   Single White or Caucasian Not Hispanic or Latino  female   G1P1001 with No LMP recorded. (Menstrual status: IUD).   here for UTI symptoms that began Saturday. Symptoms include urinary urgency, frequency, voiding small amounts and dysuria. Has been taking AZO daily. Took 2 Macrobid 100 mg since Sunday, took a pyridium left over as well. Symptoms have improved. No fever or flank.  GYNECOLOGIC HISTORY: No LMP recorded. (Menstrual status: IUD). Contraception: IUD Menopausal hormone therapy: None        OB History    Gravida  1   Para  1   Term  1   Preterm      AB      Living  1     SAB      TAB      Ectopic      Multiple      Live Births  1              Patient Active Problem List   Diagnosis Date Noted  . Onychomycosis 03/12/2015  . 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 CIGARETTE SMOKER 03/03/2008  . Allergic rhinitis 03/03/2008  . Attention deficit disorder (ADD) in adult 02/01/2007    Past Medical History:  Diagnosis Date  . Abnormal Pap smear of cervix   . Blood transfusion   . Blood transfusion without reported diagnosis   . CELLULITIS, FOOT, LEFT 09/16/2008  . Dermatophytosis of nail 02/01/2007       . STD (sexually transmitted disease) 06/2009   trichamonus    Past Surgical History:  Procedure Laterality Date  . COLPOSCOPY    . FOOT SURGERY Right 2010  . INTRAUTERINE DEVICE (IUD) INSERTION  03/2016   Mirena     Current Outpatient Medications  Medication Sig Dispense Refill  . amphetamine-dextroamphetamine (ADDERALL) 20 MG tablet Take 1 tablet (20 mg total) by mouth 3 (three) times daily. May fill today. 90 tablet 0  . levonorgestrel (MIRENA) 20 MCG/24HR IUD 1 each by Intrauterine route once.     No current facility-administered medications for this visit.      ALLERGIES: Sulfamethoxazole-trimethoprim  Family History  Problem Relation Age of Onset  .  Hyperlipidemia Father        fhx  . Hypertension Father        fhx  . Cancer Other        prostate/fhx  . Osteoporosis Mother   . Lung cancer Maternal Grandmother   . Lung cancer Paternal Grandmother   . Lung cancer Paternal Grandfather   . Liver cancer Maternal Grandfather     Social History   Socioeconomic History  . Marital status: Single    Spouse name: Not on file  . Number of children: Not on file  . Years of education: Not on file  . Highest education level: Not on file  Occupational History  . Not on file  Social Needs  . Financial resource strain: Not on file  . Food insecurity:    Worry: Not on file    Inability: Not on file  . Transportation needs:    Medical: Not on file    Non-medical: Not on file  Tobacco Use  . Smoking status: Current Every Day Smoker    Packs/day: 0.50    Types: Cigarettes  . Smokeless tobacco: Never Used  Substance and Sexual Activity  . Alcohol use: Yes    Alcohol/week: 6.0  standard drinks    Types: 6 Standard drinks or equivalent per week  . Drug use: No  . Sexual activity: Yes    Partners: Male    Birth control/protection: I.U.D.  Lifestyle  . Physical activity:    Days per week: Not on file    Minutes per session: Not on file  . Stress: Not on file  Relationships  . Social connections:    Talks on phone: Not on file    Gets together: Not on file    Attends religious service: Not on file    Active member of club or organization: Not on file    Attends meetings of clubs or organizations: Not on file    Relationship status: Not on file  . Intimate partner violence:    Fear of current or ex partner: Not on file    Emotionally abused: Not on file    Physically abused: Not on file    Forced sexual activity: Not on file  Other Topics Concern  . Not on file  Social History Narrative   Divorced-currently dating.  Has 1 daughter from prior marriage    Review of Systems  Constitutional: Negative.   HENT: Negative.    Eyes: Negative.   Respiratory: Negative.   Cardiovascular: Negative.   Gastrointestinal: Negative.   Genitourinary: Positive for dysuria, frequency and urgency.  Musculoskeletal: Negative.   Skin: Negative.   Neurological: Negative.   Endo/Heme/Allergies: Negative.     PHYSICAL EXAMINATION:    BP 120/78 (BP Location: Left Arm, Patient Position: Sitting, Cuff Size: Normal)   Pulse 64   Temp 98.5 F (36.9 C) (Skin)   Wt 115 lb (52.2 kg)   BMI 20.05 kg/m     General appearance: alert, cooperative and appears stated age CVA: not tender Abdomen: soft, non-tender; non distended, no masses,  no organomegaly  ASSESSMENT UTI, partially treated    PLAN Send urine for ua, c&s Treat with macrobid and pyridium (will finish even if culture is negative) Discussed possible prophylactic use of antibiotics with intercourse    An After Visit Summary was printed and given to the patient.

## 2018-05-23 LAB — URINALYSIS, MICROSCOPIC ONLY
Bacteria, UA: NONE SEEN
Casts: NONE SEEN /lpf

## 2018-05-23 NOTE — Progress Notes (Signed)
Phone [phone]   Subjective:  Virtual visit via Video note. Chief complaint: Chief Complaint  Patient presents with  . ADHD    6 MONTH F/U   This visit type was conducted due to national recommendations for restrictions regarding the COVID-19 Pandemic (e.g. social distancing).  This format is felt to be most appropriate for this patient at this time balancing risks to patient and risks to population by having him in for in person visit.  No physical exam was performed (except for noted visual exam or audio findings with Telehealth visits).    Our team/I connected with Hayley Miranda at  8:40 AM EDT by a video enabled telemedicine application (doxy.me or caregility through epic) and verified that I am speaking with the correct person using two identifiers.  Location patient: [place] Location provider: [facility], office Persons participating in the virtual visit:  patient  Our team/I discussed the limitations of evaluation and management by telemedicine and the availability of in person appointments. In light of current covid-19 pandemic, patient also understands that we are trying to protect them by minimizing in office contact if at all possible.  The patient expressed consent for telemedicine visit and agreed to proceed. Patient understands insurance will be billed.   ROS- getting over recent UTI.  No unintentional weight loss.  No chest pain or shortness of breath or palpitations.  Past Medical History-  Patient Active Problem List   Diagnosis Date Noted  . CIGARETTE SMOKER 03/03/2008    Priority: High  . Attention deficit disorder (ADD) in adult 02/01/2007    Priority: Medium  . Onychomycosis 03/12/2015    Priority: Low  . 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 Allergic rhinitis 03/03/2008    Priority: Low    Medications- reviewed and updated Current Outpatient Medications  Medication Sig Dispense  Refill  . amphetamine-dextroamphetamine (ADDERALL) 20 MG tablet Take 1 tablet (20 mg total) by mouth 3 (three) times daily. May fill today. 90 tablet 0  . levonorgestrel (MIRENA) 20 MCG/24HR IUD 1 each by Intrauterine route once.    . nitrofurantoin, macrocrystal-monohydrate, (MACROBID) 100 MG capsule Take 1 capsule (100 mg total) by mouth 2 (two) times daily. 10 capsule 0  . amphetamine-dextroamphetamine (ADDERALL) 20 MG tablet Take 1 tablet (20 mg total) by mouth 3 (three) times daily. May fill in 1 month 90 tablet 0  . amphetamine-dextroamphetamine (ADDERALL) 20 MG tablet Take 1 tablet (20 mg total) by mouth 3 (three) times daily. May fill in 2 months 90 tablet 0  . phenazopyridine (PYRIDIUM) 200 MG tablet Take 1 tablet (200 mg total) by mouth 3 (three) times daily as needed. (Patient not taking: Reported on 05/24/2018) 6 tablet 0   No current facility-administered medications for this visit.      Objective:  Ht 5' 3.5" (1.613 m)   Wt 115 lb (52.2 kg)   BMI 20.05 kg/m  self reported vitals Gen: NAD, resting comfortably Lungs: nonlabored, normal respiratory rate  Skin: appears dry, no obvious rash     Assessment and Plan   #ADD S: Patient symptoms are stable on Adderall 20 mg 3 times a day.  She feels that this is well controlled. 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  -Diagnosed in 6 grade - Contract signed September 10, 2014 - [facility] reviewed-only prescriptions have been through me on a monthly  basis. -11/2017 UDS A/P: Stable. Continue current medications. Refills for 3 months- could do a physical in 6 months   #Smoking/COPD S: Patient previously diagnosed with this by physician in 2013-has not been formally evaluated by PFTs.  Patient continues smoking but down to 1 pack per 3 days.  A/P: breathing stable-still has intermittent cough.  Potential COPD appears stable.  For smoking-advised  cessation-she is not ready to quit - goal is to quit by 38  Future Appointments  Date Time Provider Department Center  07/02/2018  2:00 PM Romualdo Bolk, MD [facility] None   Lab/Order associations: Attention deficit disorder (ADD) in adult  CIGARETTE SMOKER  COPD (chronic obstructive pulmonary disease) with chronic bronchitis (HCC)  Meds ordered this encounter  Medications  . amphetamine-dextroamphetamine (ADDERALL) 20 MG tablet    Sig: Take 1 tablet (20 mg total) by mouth 3 (three) times daily. May fill today.    Dispense:  90 tablet    Refill:  0  . amphetamine-dextroamphetamine (ADDERALL) 20 MG tablet    Sig: Take 1 tablet (20 mg total) by mouth 3 (three) times daily. May fill in 1 month    Dispense:  90 tablet    Refill:  0  . amphetamine-dextroamphetamine (ADDERALL) 20 MG tablet    Sig: Take 1 tablet (20 mg total) by mouth 3 (three) times daily. May fill in 2 months    Dispense:  90 tablet    Refill:  0   Return precautions advised.  Tana Conch, MD

## 2018-05-24 ENCOUNTER — Encounter: Payer: Self-pay | Admitting: Family Medicine

## 2018-05-24 ENCOUNTER — Ambulatory Visit (INDEPENDENT_AMBULATORY_CARE_PROVIDER_SITE_OTHER): Payer: 59 | Admitting: Family Medicine

## 2018-05-24 VITALS — Ht 63.5 in | Wt 115.0 lb

## 2018-05-24 DIAGNOSIS — J449 Chronic obstructive pulmonary disease, unspecified: Secondary | ICD-10-CM

## 2018-05-24 DIAGNOSIS — F17200 Nicotine dependence, unspecified, uncomplicated: Secondary | ICD-10-CM

## 2018-05-24 DIAGNOSIS — F988 Other specified behavioral and emotional disorders with onset usually occurring in childhood and adolescence: Secondary | ICD-10-CM | POA: Diagnosis not present

## 2018-05-24 LAB — URINE CULTURE

## 2018-05-24 MED ORDER — AMPHETAMINE-DEXTROAMPHETAMINE 20 MG PO TABS: 20.0000 mg | ORAL_TABLET | Freq: Three times a day (TID) | ORAL | 0 refills | Status: DC

## 2018-05-24 NOTE — Patient Instructions (Addendum)
There are no preventive care reminders to display for this patient.  Depression screen [facility] 2/9 11/21/2017 10/25/2016  Decreased Interest 0 0  Down, Depressed, Hopeless 0 0  PHQ - 2 Score 0 0   6 month physical or follow up

## 2018-05-29 ENCOUNTER — Telehealth: Payer: Self-pay

## 2018-05-29 NOTE — Telephone Encounter (Signed)
Left detailed message for patient with results, okay per ROI. Advised to return call with any questions or concerns.  Notes recorded by Romualdo Bolk, MD on 05/25/2018 at 11:05 AM EDT Please let the patient know that her culture is negative. I would complete her antibiotics since she partially treated herself. ------  Notes recorded by Romualdo Bolk, MD on 05/23/2018 at 8:10 AM EDT Normal, will await culture

## 2018-06-19 ENCOUNTER — Telehealth: Payer: Self-pay | Admitting: Family Medicine

## 2018-06-19 NOTE — Telephone Encounter (Signed)
May discuss with pharmacy and authorize early fill in order to allow for travel

## 2018-06-19 NOTE — Telephone Encounter (Signed)
Pt called stating she would like to have this medication, amphetamine-dextroamphetamine (ADDERALL) 20 MG tablet,  sent in early because she is leaving out of town for a week. Pt is requesting to have it filled on Thursday. Please advise.   [id number] - [address], [place]  [address] [place] [address]  Phone: [phone] Fax: [phone]  Open 24 hours

## 2018-06-19 NOTE — Telephone Encounter (Signed)
Last OV 05/24/18 Last refill 05/24/18 #90/2

## 2018-06-19 NOTE — Telephone Encounter (Signed)
OK for early refill

## 2018-06-20 NOTE — Telephone Encounter (Signed)
Called pharmacy and authorized early refill. Called pt and advised.

## 2018-07-02 ENCOUNTER — Ambulatory Visit: Payer: 59 | Admitting: Obstetrics and Gynecology

## 2018-07-02 NOTE — Progress Notes (Deleted)
38 y.o. G1P1001 Single White or Caucasian Not Hispanic or Latino female here for annual exam.      No LMP recorded. (Menstrual status: IUD).          Sexually active: {yes no:314532}  The current method of family planning is {contraception:315051}.    Exercising: {yes no:314532}  {types:19826} Smoker:  {YES NO:22349}  Health Maintenance: Pap:  03-28-16 WNL NEG HR HPV, 03-26-15 WNL NEG HR HPV  History of abnormal Pap:  Yes 01-19-11 ASCUS, HX CIN I Colpo BX 2010  MMG:  04-01-15 Birads 1 negative  TDaP:  06-10-15 Gardasil: no     reports that she has been smoking cigarettes. She has been smoking about 0.50 packs per day. She has never used smokeless tobacco. She reports current alcohol use of about 6.0 standard drinks of alcohol per week. She reports that she does not use drugs.  Past Medical History:  Diagnosis Date  . Abnormal Pap smear of cervix   . Blood transfusion   . Blood transfusion without reported diagnosis   . CELLULITIS, FOOT, LEFT 09/16/2008  . Dermatophytosis of nail 02/01/2007       . STD (sexually transmitted disease) 06/2009   trichamonus    Past Surgical History:  Procedure Laterality Date  . COLPOSCOPY    . FOOT SURGERY Right 2010  . INTRAUTERINE DEVICE (IUD) INSERTION  03/2016   Mirena     Current Outpatient Medications  Medication Sig Dispense Refill  . amphetamine-dextroamphetamine (ADDERALL) 20 MG tablet Take 1 tablet (20 mg total) by mouth 3 (three) times daily. May fill today. 90 tablet 0  . amphetamine-dextroamphetamine (ADDERALL) 20 MG tablet Take 1 tablet (20 mg total) by mouth 3 (three) times daily. May fill in 1 month 90 tablet 0  . amphetamine-dextroamphetamine (ADDERALL) 20 MG tablet Take 1 tablet (20 mg total) by mouth 3 (three) times daily. May fill in 2 months 90 tablet 0  . levonorgestrel (MIRENA) 20 MCG/24HR IUD 1 each by Intrauterine route once.    . nitrofurantoin, macrocrystal-monohydrate, (MACROBID) 100 MG capsule Take 1 capsule (100 mg  total) by mouth 2 (two) times daily. 10 capsule 0  . phenazopyridine (PYRIDIUM) 200 MG tablet Take 1 tablet (200 mg total) by mouth 3 (three) times daily as needed. (Patient not taking: Reported on 05/24/2018) 6 tablet 0   No current facility-administered medications for this visit.     Family History  Problem Relation Age of Onset  . Hyperlipidemia Father        fhx  . Hypertension Father        fhx  . Cancer Other        prostate/fhx  . Osteoporosis Mother   . Lung cancer Maternal Grandmother   . Lung cancer Paternal Grandmother   . Lung cancer Paternal Grandfather   . Liver cancer Maternal Grandfather     Review of Systems  Exam:   There were no vitals taken for this visit.  Weight change: @WEIGHTCHANGE @ Height:      Ht Readings from Last 3 Encounters:  05/24/18 5' 3.5" (1.613 m)  11/21/17 5' 3.5" (1.613 m)  09/05/17 5' 3.5" (1.613 m)    General appearance: alert, cooperative and appears stated age Head: Normocephalic, without obvious abnormality, atraumatic Neck: no adenopathy, supple, symmetrical, trachea midline and thyroid {CHL AMB PHY EX THYROID NORM [phone]::"normal to inspection and palpation"} Lungs: clear to auscultation bilaterally Cardiovascular: regular rate and rhythm Breasts: {Exam; breast:13139::"normal appearance, no masses or tenderness"} Abdomen: soft, non-tender; non  distended,  no masses,  no organomegaly Extremities: extremities normal, atraumatic, no cyanosis or edema Skin: Skin color, texture, turgor normal. No rashes or lesions Lymph nodes: Cervical, supraclavicular, and axillary nodes normal. No abnormal inguinal nodes palpated Neurologic: Grossly normal   Pelvic: External genitalia:  no lesions              Urethra:  normal appearing urethra with no masses, tenderness or lesions              Bartholins and Skenes: normal                 Vagina: normal appearing vagina with normal color and discharge, no lesions               Cervix: {CHL AMB PHY EX CERVIX NORM DEFAULT:[phone]::"no lesions"}               Bimanual Exam:  Uterus:  {CHL AMB PHY EX UTERUS NORM [phone]::"normal size, contour, position, consistency, mobility, non-tender"}              Adnexa: {CHL AMB PHY EX ADNEXA NO MASS [phone]::"no mass, fullness, tenderness"}               Rectovaginal: Confirms               Anus:  normal sphincter tone, no lesions  Chaperone was present for exam.  A:  Well Woman with normal exam  P:

## 2018-07-06 ENCOUNTER — Other Ambulatory Visit: Payer: Self-pay

## 2018-07-06 ENCOUNTER — Ambulatory Visit (HOSPITAL_COMMUNITY)
Admission: EM | Admit: 2018-07-06 | Discharge: 2018-07-06 | Disposition: A | Discharge status: Home / Self Care | Payer: 59 | Attending: Urgent Care | Admitting: Urgent Care

## 2018-07-06 ENCOUNTER — Telehealth: Payer: 59

## 2018-07-06 ENCOUNTER — Encounter: Payer: Self-pay | Admitting: Family Medicine

## 2018-07-06 ENCOUNTER — Encounter (HOSPITAL_COMMUNITY): Payer: Self-pay | Admitting: Emergency Medicine

## 2018-07-06 DIAGNOSIS — J0110 Acute frontal sinusitis, unspecified: Secondary | ICD-10-CM

## 2018-07-06 DIAGNOSIS — J449 Chronic obstructive pulmonary disease, unspecified: Secondary | ICD-10-CM

## 2018-07-06 DIAGNOSIS — J3089 Other allergic rhinitis: Secondary | ICD-10-CM

## 2018-07-06 MED ORDER — AMOXICILLIN 875 MG PO TABS: 875.0000 mg | ORAL_TABLET | Freq: Two times a day (BID) | ORAL | 0 refills | Status: DC

## 2018-07-06 NOTE — ED Triage Notes (Signed)
Pt here for nasal congestion and facial pressure

## 2018-07-06 NOTE — Discharge Instructions (Addendum)
You may take 500mg Tylenol with ibuprofen 600mg every 6 hours for pain and inflammation. ° °

## 2018-07-06 NOTE — ED Provider Notes (Signed)
MRN: [id number] DOB: Oct 16, 1980  Subjective:   Hayley Miranda is a 38 y.o. female presenting for 2 week history of persistent pressure lateral portions of frontal part of head radiating onto her neck/lymph nodes, around her ears. 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 Used Q-tips to try and clear her ears, Allegra-D, Mucinex is no longer working. Smokes ~1/2ppd.   No current facility-administered medications for this encounter.   Current Outpatient Medications:  .  amphetamine-dextroamphetamine (ADDERALL) 20 MG tablet, Take 1 tablet (20 mg total) by mouth 3 (three) times daily. May fill today., Disp: 90 tablet, Rfl: 0 .  amphetamine-dextroamphetamine (ADDERALL) 20 MG tablet, Take 1 tablet (20 mg total) by mouth 3 (three) times daily. May fill in 1 month, Disp: 90 tablet, Rfl: 0 .  amphetamine-dextroamphetamine (ADDERALL) 20 MG tablet, Take 1 tablet (20 mg total) by mouth 3 (three) times daily. May fill in 2 months, Disp: 90 tablet, Rfl: 0 .  levonorgestrel (MIRENA) 20 MCG/24HR IUD, 1 each by Intrauterine route once., Disp: , Rfl:     Allergies  Allergen Reactions  . Sulfamethoxazole-Trimethoprim     REACTION: rash--fever    Past Medical History:  Diagnosis Date  . Abnormal Pap smear of cervix   . Blood transfusion   . Blood transfusion without reported diagnosis   . CELLULITIS, FOOT, LEFT 09/16/2008  . Dermatophytosis of nail 02/01/2007       . STD (sexually transmitted disease) 06/2009   trichamonus     Past Surgical History:  Procedure Laterality Date  . COLPOSCOPY    . FOOT SURGERY Right 2010  . INTRAUTERINE DEVICE (IUD) INSERTION  03/2016   Mirena     Review of Systems  Constitutional: Negative for fever and malaise/fatigue.  HENT: Positive for congestion and ear pain. Negative for sinus pain and sore throat.   Eyes: Negative for blurred vision, double vision, discharge and redness.  Respiratory: Negative for cough, hemoptysis, shortness of  breath and wheezing.   Cardiovascular: Negative for chest pain.  Gastrointestinal: Negative for abdominal pain, diarrhea, nausea and vomiting.  Genitourinary: Negative for dysuria, flank pain and hematuria.  Musculoskeletal: Negative for myalgias.  Skin: Negative for rash.  Neurological: Negative for dizziness, weakness and headaches.  Psychiatric/Behavioral: Negative for depression and substance abuse.    Objective:   Vitals: BP 129/76 (BP Location: Left Arm)   Pulse 80   Temp 98 F (36.7 C) (Oral)   Resp 18   SpO2 98%   Physical Exam Constitutional:      General: She is not in acute distress.    Appearance: She is well-developed. She is not ill-appearing.  HENT:     Head: Normocephalic and atraumatic.     Right Ear: Tympanic membrane and ear canal normal. No drainage or tenderness. No middle ear effusion. Tympanic membrane is not erythematous.     Left Ear: Tympanic membrane and ear canal normal. No drainage or tenderness.  No middle ear effusion. Tympanic membrane is not erythematous.     Nose: No congestion or rhinorrhea.     Comments: Nasal turbinates dry and erythematous.    Mouth/Throat:     Mouth: Mucous membranes are moist. No oral lesions.     Pharynx: Oropharynx is clear. No pharyngeal swelling, oropharyngeal exudate, posterior oropharyngeal erythema or uvula swelling.     Tonsils: No tonsillar exudate or tonsillar abscesses.  Eyes:     Extraocular Movements:     Right eye: Normal extraocular motion.  Left eye: Normal extraocular motion.     Conjunctiva/sclera: Conjunctivae normal.     Pupils: Pupils are equal, round, and reactive to light.  Neck:     Musculoskeletal: Normal range of motion and neck supple.  Cardiovascular:     Rate and Rhythm: Normal rate.  Pulmonary:     Effort: Pulmonary effort is normal.  Lymphadenopathy:     Cervical: Cervical adenopathy present.  Skin:    General: Skin is warm and dry.  Neurological:     General: No focal deficit  present.     Mental Status: She is alert and oriented to person, place, and time.  Psychiatric:        Mood and Affect: Mood normal.        Behavior: Behavior normal.     Assessment and Plan :   1. Acute frontal sinusitis, recurrence not specified   2. Allergic rhinitis due to other allergic trigger, unspecified seasonality   3. Chronic obstructive pulmonary disease, unspecified COPD type ([place])    Will cover for sinusitis with amoxicillin.  Patient is to maintain Allegra, pseudoephedrine.  Hold off on nasal steroid until she clears her sinus infection.  Recommended patient hold off on smoking until symptoms resolve at least. Counseled patient on potential for adverse effects with medications prescribed/recommended today, ER and return-to-clinic precautions discussed, patient verbalized understanding.    Jaynee Eagles, [place] 07/06/18 [id number]

## 2018-08-08 ENCOUNTER — Encounter: Payer: Self-pay | Admitting: Obstetrics and Gynecology

## 2018-08-08 ENCOUNTER — Other Ambulatory Visit: Payer: Self-pay

## 2018-08-08 ENCOUNTER — Ambulatory Visit: Payer: 59 | Admitting: Obstetrics and Gynecology

## 2018-08-08 VITALS — BP 110/80 | HR 80 | Temp 98.4°F | Ht 63.5 in | Wt 109.2 lb

## 2018-08-08 DIAGNOSIS — Z01419 Encounter for gynecological examination (general) (routine) without abnormal findings: Secondary | ICD-10-CM

## 2018-08-08 DIAGNOSIS — Z30431 Encounter for routine checking of intrauterine contraceptive device: Secondary | ICD-10-CM | POA: Diagnosis not present

## 2018-08-08 NOTE — Patient Instructions (Signed)
EXERCISE AND DIET:  We recommended that you start or continue a regular exercise program for good health. Regular exercise means any activity that makes your heart beat faster and makes you sweat.  We recommend exercising at least 30 minutes per day at least 3 days a week, preferably 4 or 5.  We also recommend a diet low in fat and sugar.  Inactivity, poor dietary choices and obesity can cause diabetes, heart attack, stroke, and kidney damage, among others.    ALCOHOL AND SMOKING:  Women should limit their alcohol intake to no more than 7 drinks/beers/glasses of wine (combined, not each!) per week. Moderation of alcohol intake to this level decreases your risk of breast cancer and liver damage. And of course, no recreational drugs are part of a healthy lifestyle.  And absolutely no smoking or even second hand smoke. Most people know smoking can cause heart and lung diseases, but did you know it also contributes to weakening of your bones? Aging of your skin?  Yellowing of your teeth and nails?  CALCIUM AND VITAMIN D:  Adequate intake of calcium and Vitamin D are recommended.  The recommendations for exact amounts of these supplements seem to change often, but generally speaking 1,000 mg of calcium (between diet and supplement) and 800 units of Vitamin D per day seems prudent. Certain women may benefit from higher intake of Vitamin D.  If you are among these women, your doctor will have told you during your visit.    PAP SMEARS:  Pap smears, to check for cervical cancer or precancers,  have traditionally been done yearly, although recent scientific advances have shown that most women can have pap smears less often.  However, every woman still should have a physical exam from her gynecologist every year. It will include a breast check, inspection of the vulva and vagina to check for abnormal growths or skin changes, a visual exam of the cervix, and then an exam to evaluate the size and shape of the uterus and  ovaries.  And after 40 years of age, a rectal exam is indicated to check for rectal cancers. We will also provide age appropriate advice regarding health maintenance, like when you should have certain vaccines, screening for sexually transmitted diseases, bone density testing, colonoscopy, mammograms, etc.   MAMMOGRAMS:  All women over 40 years old should have a yearly mammogram. Many facilities now offer a "3D" mammogram, which may cost around $50 extra out of pocket. If possible,  we recommend you accept the option to have the 3D mammogram performed.  It both reduces the number of women who will be called back for extra views which then turn out to be normal, and it is better than the routine mammogram at detecting truly abnormal areas.    COLON CANCER SCREENING: Now recommend starting at age 45. At this time colonoscopy is not covered for routine screening until 50. There are take home tests that can be done between 45-49.   COLONOSCOPY:  Colonoscopy to screen for colon cancer is recommended for all women at age 50.  We know, you hate the idea of the prep.  We agree, BUT, having colon cancer and not knowing it is worse!!  Colon cancer so often starts as a polyp that can be seen and removed at colonscopy, which can quite literally save your life!  And if your first colonoscopy is normal and you have no family history of colon cancer, most women don't have to have it again for   10 years.  Once every ten years, you can do something that may end up saving your life, right?  We will be happy to help you get it scheduled when you are ready.  Be sure to check your insurance coverage so you understand how much it will cost.  It may be covered as a preventative service at no cost, but you should check your particular policy.      Breast Self-Awareness Breast self-awareness means being familiar with how your breasts look and feel. It involves checking your breasts regularly and reporting any changes to your  health care provider. Practicing breast self-awareness is important. A change in your breasts can be a sign of a serious medical problem. Being familiar with how your breasts look and feel allows you to find any problems early, when treatment is more likely to be successful. All women should practice breast self-awareness, including women who have had breast implants. How to do a breast self-exam One way to learn what is normal for your breasts and whether your breasts are changing is to do a breast self-exam. To do a breast self-exam: Look for Changes  1. Remove all the clothing above your waist. 2. Stand in front of a mirror in a room with good lighting. 3. Put your hands on your hips. 4. Push your hands firmly downward. 5. Compare your breasts in the mirror. Look for differences between them (asymmetry), such as: ? Differences in shape. ? Differences in size. ? Puckers, dips, and bumps in one breast and not the other. 6. Look at each breast for changes in your skin, such as: ? Redness. ? Scaly areas. 7. Look for changes in your nipples, such as: ? Discharge. ? Bleeding. ? Dimpling. ? Redness. ? A change in position. Feel for Changes Carefully feel your breasts for lumps and changes. It is best to do this while lying on your back on the floor and again while sitting or standing in the shower or tub with soapy water on your skin. Feel each breast in the following way:  Place the arm on the side of the breast you are examining above your head.  Feel your breast with the other hand.  Start in the nipple area and make  inch (2 cm) overlapping circles to feel your breast. Use the pads of your three middle fingers to do this. Apply light pressure, then medium pressure, then firm pressure. The light pressure will allow you to feel the tissue closest to the skin. The medium pressure will allow you to feel the tissue that is a little deeper. The firm pressure will allow you to feel the tissue  close to the ribs.  Continue the overlapping circles, moving downward over the breast until you feel your ribs below your breast.  Move one finger-width toward the center of the body. Continue to use the  inch (2 cm) overlapping circles to feel your breast as you move slowly up toward your collarbone.  Continue the up and down exam using all three pressures until you reach your armpit.  Write Down What You Find  Write down what is normal for each breast and any changes that you find. Keep a written record with breast changes or normal findings for each breast. By writing this information down, you do not need to depend only on memory for size, tenderness, or location. Write down where you are in your menstrual cycle, if you are still menstruating. If you are having trouble noticing differences   in your breasts, do not get discouraged. With time you will become more familiar with the variations in your breasts and more comfortable with the exam. How often should I examine my breasts? Examine your breasts every month. If you are breastfeeding, the best time to examine your breasts is after a feeding or after using a breast pump. If you menstruate, the best time to examine your breasts is 5-7 days after your period is over. During your period, your breasts are lumpier, and it may be more difficult to notice changes. When should I see my health care provider? See your health care provider if you notice:  A change in shape or size of your breasts or nipples.  A change in the skin of your breast or nipples, such as a reddened or scaly area.  Unusual discharge from your nipples.  A lump or thick area that was not there before.  Pain in your breasts.  Anything that concerns you.   Mammogram Facilities  Yearly screening mammograms are recommended for women beginning at age 40. For a routine screening mammogram, you may schedule the appointment and have it done at the location of your choice.   Please ask the facility to send the results to our office. (fax [phone]) Location options include:  [facility] [address], [address] [place], [place] [address] [phone]  [facility] [address], [address] [place], [place] [address] [phone]  

## 2018-08-08 NOTE — Progress Notes (Signed)
38 y.o. G1P1001 Single White or Caucasian Not Hispanic or Latino female here for annual exam.  She has a mirena IUD, placed in 3/18. Just occasional bleeding with IUD. Same partner for over 1 year, not regularly using condoms. No dyspareunia.   Period Duration (Days): 4 Period Pattern: (!) Irregular(IUD) Menstrual Flow: Light Dysmenorrhea: None  Patient's last menstrual period was 08/02/2018 (exact date).          Sexually active: Yes.    The current method of family planning is IUD.    Exercising: Yes.    crossfit Smoker:  Down to 6 cigarettes a day, plans to quit on her 37 th birthday later this month.   Health Maintenance: Pap:  03-28-16 WNL NEG HR HPV, 03-26-15 WNL NEG HR HPV  History of abnormal Pap:  Yes 01-19-11 ASCUS, HX CIN I Colpo BX 2010  MMG:  04-01-15 WNL  TDaP:  06-10-15 Gardasil: no    reports that she has been smoking cigarettes. She has been smoking about 0.25 packs per day. She has never used smokeless tobacco. She reports current alcohol use of about 6.0 standard drinks of alcohol per week. She reports that she does not use drugs. Works in a [employer] logistics/support/administrative officer. Daughter is 68.  Past Medical History:  Diagnosis Date  . Abnormal Pap smear of cervix   . Blood transfusion   . Blood transfusion without reported diagnosis   . CELLULITIS, FOOT, LEFT 09/16/2008  . Dermatophytosis of nail 02/01/2007       . STD (sexually transmitted disease) 06/2009   trichamonus    Past Surgical History:  Procedure Laterality Date  . COLPOSCOPY    . FOOT SURGERY Right 2010  . INTRAUTERINE DEVICE (IUD) INSERTION  03/2016   Mirena     Current Outpatient Medications  Medication Sig Dispense Refill  . amphetamine-dextroamphetamine (ADDERALL) 20 MG tablet Take 1 tablet (20 mg total) by mouth 3 (three) times daily. May fill today. 90 tablet 0  . amphetamine-dextroamphetamine (ADDERALL) 20 MG tablet Take 1 tablet (20 mg total) by mouth 3 (three) times daily. May fill in 1 month 90 tablet 0  .  amphetamine-dextroamphetamine (ADDERALL) 20 MG tablet Take 1 tablet (20 mg total) by mouth 3 (three) times daily. May fill in 2 months 90 tablet 0  . fexofenadine-pseudoephedrine (ALLEGRA-D 24) 180-240 MG 24 hr tablet Take 1 tablet by mouth as needed.    Marland Kitchen levonorgestrel (MIRENA) 20 MCG/24HR IUD 1 each by Intrauterine route once.     No current facility-administered medications for this visit.     Family History  Problem Relation Age of Onset  . Hyperlipidemia Father        fhx  . Hypertension Father        fhx  . Cancer Other        prostate/fhx  . Osteoporosis Mother   . Lung cancer Maternal Grandmother   . Lung cancer Paternal Grandmother   . Lung cancer Paternal Grandfather   . Liver cancer Maternal Grandfather     Review of Systems  Constitutional: Negative.   HENT: Negative.   Eyes: Negative.   Respiratory: Negative.   Cardiovascular: Negative.   Gastrointestinal: Negative.   Endocrine: Negative.   Genitourinary: Negative.   Musculoskeletal: Negative.   Skin: Negative.   Allergic/Immunologic: Negative.   Neurological: Negative.   Hematological: Negative.   Psychiatric/Behavioral: Negative.     Exam:   BP 110/80 (BP Location: Right Arm, Patient Position: Sitting, Cuff Size: Normal)   Pulse  80   Temp 98.4 F (36.9 C) (Skin)   Ht 5' 3.5" (1.613 m)   Wt 109 lb 3.2 oz (49.5 kg)   LMP 08/02/2018 (Exact Date)   BMI 19.04 kg/m   Weight change: @WEIGHTCHANGE @ Height:   Height: 5' 3.5" (161.3 cm)  Ht Readings from Last 3 Encounters:  08/08/18 5' 3.5" (1.613 m)  05/24/18 5' 3.5" (1.613 m)  11/21/17 5' 3.5" (1.613 m)    General appearance: alert, cooperative and appears stated age Head: Normocephalic, without obvious abnormality, atraumatic Neck: no adenopathy, supple, symmetrical, trachea midline and thyroid normal to inspection and palpation Lungs: clear to auscultation bilaterally Cardiovascular: regular rate and rhythm Breasts: stable, pea sized lump in the  left breast at 7 o'clock a few cm from the areolar region. No other lumps, no skin changes Abdomen: soft, non-tender; non distended,  no masses,  no organomegaly Extremities: extremities normal, atraumatic, no cyanosis or edema Skin: Skin color, texture, turgor normal. No rashes or lesions Lymph nodes: Cervical, supraclavicular, and axillary nodes normal. No abnormal inguinal nodes palpated Neurologic: Grossly normal   Pelvic: External genitalia:  no lesions              Urethra:  normal appearing urethra with no masses, tenderness or lesions              Bartholins and Skenes: normal                 Vagina: normal appearing vagina with normal color and discharge, no lesions              Cervix: no lesions and IUD string 3 cm               Bimanual Exam:  Uterus:  normal size, contour, position, consistency, mobility, non-tender              Adnexa: no mass, fullness, tenderness               Rectovaginal: Confirms               Anus:  normal sphincter tone, no lesions  Chaperone was present for exam.  A:  Well Woman with normal exam  Stable left sided breast lump (prior imaging was normal)  IUD check, doing well  P:   No pap this year  Discussed breast self exam  Discussed calcium and vit D intake  Mammogram after she is 38  Labs with primary

## 2018-08-16 ENCOUNTER — Other Ambulatory Visit: Payer: Self-pay | Admitting: Family Medicine

## 2018-08-16 MED ORDER — AMPHETAMINE-DEXTROAMPHETAMINE 20 MG PO TABS: 20.0000 mg | ORAL_TABLET | Freq: Three times a day (TID) | ORAL | 0 refills | Status: DC

## 2018-08-16 NOTE — Telephone Encounter (Signed)
Last OV:  05/24/2018 Next OV:  11/23/2018 (CPE) Last Fill:  07/20/2018  Please advise.

## 2018-08-16 NOTE — Telephone Encounter (Signed)
Medication Refill - Medication:  amphetamine-dextroamphetamine (ADDERALL) 20 MG tablet  Has the patient contacted their pharmacy? Yes advised to call PCP. Patient is wanting 3 separate scripts for a 3 month supply.   Preferred Pharmacy (with phone number or street name):  [id number] - [place], [address] [place] [phone] (Phone) [phone] (Fax)   Agent: Please be advised that RX refills may take up to 3 business days. We ask that you follow-up with your pharmacy.

## 2018-11-12 ENCOUNTER — Other Ambulatory Visit: Payer: Self-pay | Admitting: Family Medicine

## 2018-11-12 MED ORDER — AMPHETAMINE-DEXTROAMPHETAMINE 20 MG PO TABS: 20.0000 mg | ORAL_TABLET | Freq: Three times a day (TID) | ORAL | 0 refills | Status: DC

## 2018-11-12 NOTE — Telephone Encounter (Signed)
See below

## 2018-11-12 NOTE — Telephone Encounter (Signed)
Rx sent to [facility] for refill.

## 2018-11-12 NOTE — Telephone Encounter (Signed)
Copied from [place] [phone]. Topic: Quick Communication - Rx Refill/Question >> Nov 12, 2018  1:42 PM Yvette Rack wrote: Medication: amphetamine-dextroamphetamine (ADDERALL) 20 MG tablet  Has the patient contacted their pharmacy? no  Preferred Pharmacy (with phone number or street name): [id number] - [place], [place] [phone] (Phone)   [phone] (Fax)  Agent: Please be advised that RX refills may take up to 3 business days. We ask that you follow-up with your pharmacy.

## 2018-11-22 ENCOUNTER — Other Ambulatory Visit: Payer: Self-pay

## 2018-11-22 NOTE — Patient Instructions (Addendum)
Health Maintenance Due  Topic Date Due  . INFLUENZA VACCINE -today  08/04/2018   Recommended follow up: Return in about 6 months (around 05/23/2019) for  f/u ADHD .  We can refill by phone in 3 months or by MyChart  Congrats on quitting smoking! So proud of you   Please stop by lab before you go If you do not have mychart- we will call you about results within 5 business days of [place] receiving them.  If you have mychart- we will send your results within 3 business days of [place] receiving them.  If abnormal or we want to clarify a result, we will call or mychart you to make sure you receive the message.  If you have questions or concerns or don't hear within 5-7 days, please send [place] a message or call us.         Marland Kitchen.It takes about 2 weeks for protection to develop after vaccination.  There are many flu viruses, and they are always changing. Each year a new flu vaccine is made to protect against three or four viruses that are likely to cause disease in the upcoming flu season. Even when the vaccine doesn't exactly match these viruses, it may still provide some protection.   Influenza vaccine does not cause flu.  Influenza vaccine may be given at the same time as other vaccines.  3. Talk with your health care provider  Tell your vaccine provider if the person getting the vaccine: ; Has had an allergic reaction after a previous dose of influenza vaccine, or has any severe, life-threatening allergies.  ; Has ever had Guillain-Barr Syndrome (also called GBS).  In some cases, your health care provider may decide to postpone influenza vaccination to a future visit.  People with minor illnesses, such as a cold, may be vaccinated. People who are moderately or severely ill should usually wait until they recover before getting influenza vaccine.  Your health care provider can give you more information.  4. Risks of a reaction  ; Soreness, redness, and swelling where shot is given, fever,  muscle aches, and headache can happen after influenza vaccine. ; There may be a very small increased risk of Guillain-Barr Syndrome (GBS) after inactivated influenza vaccine (the flu shot).  Young children who get the flu shot along with pneumococcal vaccine (PCV13), and/or DTaP vaccine at the same time might be slightly more likely to have a seizure caused by fever. Tell your health care provider if a child who is getting flu vaccine has ever had a seizure.  People sometimes faint after medical procedures, including vaccination. Tell your provider if you feel dizzy or have vision changes or ringing in the ears.  As with any medicine, there is a very remote chance of a vaccine causing a severe allergic reaction, other serious injury, or death.  5. What if there is a serious problem?  An allergic reaction could occur after the vaccinated person leaves the clinic. If you see signs of a severe allergic reaction (hives, swelling of the face and throat, difficulty breathing, a fast heartbeat, dizziness, or weakness), call 9-1-1 and get the person to the nearest hospital.  For other signs that concern you, call your health care provider.  Adverse reactions should be reported to the Vaccine Adverse Event Reporting System (VAERS). Your health care provider will usually file this report, or you can do it yourself. Visit the VAERS website at www.vaers.LAgents.no or call (864)474-0108.  VAERS is only for reporting reactions, and  VAERS staff do not give medical advice.  6. The National Vaccine Injury Compensation Program  The [employer] Vaccine Injury Compensation Program (VICP) is a federal program that was created to compensate people who may have been injured by certain vaccines. Visit the VICP website at GoldCloset.com.ee or call (701)500-2560 to learn about the program and about filing a claim. There is a time limit to file a claim for compensation.  7. How can I learn more?  ; Ask your  health care provider.  ; Call your local or state health department. ; Contact the Centers for Disease Control and Prevention (CDC): - Call 5105941183 (1-800-CDC-INFO) or - Visit CDC's influenza website at https://gibson.com/  Vaccine Information Statement (Interim) Inactivated Influenza Vaccine  08/17/2017 42 U.S.C.  [phone]   Department of Health and Geneticist, molecular for Disease Control and Prevention  Office Use Only

## 2018-11-22 NOTE — Progress Notes (Signed)
Phone: [phone]   Subjective:  Patient presents today for their annual physical. Chief complaint-noted.   See problem oriented charting- ROS- full  review of systems was completed and negative except for: all negative other than inattention  The following were reviewed and entered/updated in epic: Past Medical History:  Diagnosis Date  . Abnormal Pap smear of cervix   . Blood transfusion   . Blood transfusion without reported diagnosis   . CELLULITIS, FOOT, LEFT 09/16/2008  . Dermatophytosis of nail 02/01/2007       . STD (sexually transmitted disease) 06/2009   trichamonus   Patient Active Problem List   Diagnosis Date Noted  . CIGARETTE SMOKER 03/03/2008    Priority: High  . Attention deficit disorder (ADD) in adult 02/01/2007    Priority: Medium  . Onychomycosis 03/12/2015    Priority: Low  . 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 Allergic rhinitis 03/03/2008    Priority: Low   Past Surgical History:  Procedure Laterality Date  . COLPOSCOPY    . FOOT SURGERY Right 2010  . INTRAUTERINE DEVICE (IUD) INSERTION  03/2016   Mirena     Family History  Problem Relation Age of Onset  . Hyperlipidemia Father        fhx  . Hypertension Father        fhx  . Cancer Other        prostate/fhx  . Osteoporosis Mother   . Lung cancer Maternal Grandmother   . Lung cancer Paternal Grandmother   . Lung cancer Paternal Grandfather   . Liver cancer Maternal Grandfather     Medications- reviewed and updated Current Outpatient Medications  Medication Sig Dispense Refill  . amphetamine-dextroamphetamine (ADDERALL) 20 MG tablet Take 1 tablet (20 mg total) by mouth 3 (three) times daily. May fill today. 90 tablet 0  . fexofenadine-pseudoephedrine (ALLEGRA-D 24) 180-240 MG 24 hr tablet Take 1 tablet by mouth as needed.    Marland Kitchen levonorgestrel (MIRENA) 20 MCG/24HR IUD 1 each by Intrauterine route once.    Marland Kitchen  amphetamine-dextroamphetamine (ADDERALL) 20 MG tablet Take 1 tablet (20 mg total) by mouth 3 (three) times daily. 90 tablet 0  . [START ON 12/23/2018] amphetamine-dextroamphetamine (ADDERALL) 20 MG tablet Take 1 tablet (20 mg total) by mouth 3 (three) times daily. 90 tablet 0  . [START ON 01/22/2019] amphetamine-dextroamphetamine (ADDERALL) 20 MG tablet Take 1 tablet (20 mg total) by mouth 3 (three) times daily. 90 tablet 0   No current facility-administered medications for this visit.     Allergies-reviewed and updated Allergies  Allergen Reactions  . Sulfamethoxazole-Trimethoprim     REACTION: rash--fever    Social History   Social History Narrative   Divorced-currently dating.  Has 1 daughter from prior marriage   Objective  Objective:  BP (!) 116/58   Pulse 94   Temp 98.2 F (36.8 C) (Temporal)   Ht 5\' 4"  (1.626 m)   Wt 115 lb 3.2 oz (52.3 kg)   SpO2 98%   BMI 19.77 kg/m  Gen: NAD, resting comfortably HEENT: Mask not removed due to covid 19. TM normal. Bridge of nose normal. Eyelids normal.  Neck: no thyromegaly or cervical lymphadenopathy  CV: RRR no murmurs rubs or gallops Lungs: CTAB no crackles, wheeze, rhonchi Abdomen: soft/nontender/nondistended/normal bowel sounds. No rebound or guarding.  Ext: no edema Skin: warm, dry Neuro: grossly normal, moves all extremities, PERRLA   Assessment and Plan   38 y.o.  female presenting for annual physical.  Health Maintenance counseling: 1. Anticipatory guidance: Patient counseled regarding regular dental exams q6 months, eye exams- no issues has not had in several years,  avoiding smoking and second hand smoke , limiting alcohol to 1 beverage per day.   2. Risk factor reduction:  Advised patient of need for regular exercise and diet rich and fruits and vegetables to reduce risk of heart attack and stroke. Exercise- daily 45 minutes online with local personal trainer. Diet-does not eat red meat, drinks protein shakes .  Weight  up slightly from last visit- we do not want her losing weight with lower baseline BMI Wt Readings from Last 3 Encounters:  11/23/18 115 lb 3.2 oz (52.3 kg)  08/08/18 109 lb 3.2 oz (49.5 kg)  05/24/18 115 lb (52.2 kg)   3. Immunizations/screenings/ancillary studies- flu shot today Immunization History  Administered Date(s) Administered  . Influenza Whole 11/02/2009  . Influenza,inj,Quad PF,6+ Mos 10/08/2012, 09/18/2013, 09/10/2014, 10/09/2015, 10/25/2016, 11/21/2017, 11/23/2018  . Td 02/01/2007  . Tdap 06/10/2015   4. Cervical cancer screening- followed By Dr. Oscar LaJertson 03/28/2016.  5. Breast cancer screening-  breast exam monthly and and mammogram - planning to do by end of year 6. Colon cancer screening - no family history, start at age 38 7. Skin cancer screening- followed by Dermatology in the past- no recent visits. Marland Kitchen. advised regular sunscreen use. Denies worrisome, changing, or new skin lesions.  8. Birth control/STD check- Mirena.  Does not use protection with long term monogomous relationship.  9. Osteoporosis screening at 5765- not at age yet. Would likely do early screen due to her being frail, both great aunts.  -former  Smoker quit 08/2018- get UA today  Status of chronic or acute concerns   Normal lipids 2019 with LDL under 70-we will repeat in 2022 or 2024  ADD-symptoms stable on Adderall.  Refill for 3 months.  Can refill by phone or MyChart in 3 months then see Raynelle FanningJulie back in person in 6 months.  Update urine drug screen today.  All refills have been through me on [facility].  Quit smoking 08/2018! Will get ua  Recommended follow up: 3 months for ADHD  Future Appointments  Date Time Provider Department Center  08/15/2019  9:00 AM Romualdo BolkJertson, Jill Evelyn, MD [facility] None   Lab/Order associations:not  fasting   ICD-10-CM   1. Preventative health care  Z00.00 CBC with Differential/Platelet    Comprehensive metabolic panel    UDS urine drug screen    POCT Urinalysis Dipstick  (Automated)  2. Attention deficit disorder (ADD) in adult  F98.8   3. High risk medication use  Z79.899 UDS urine drug screen  4. Hyperlipidemia, unspecified hyperlipidemia type  E78.5 CBC with Differential/Platelet    Comprehensive metabolic panel  5. Need for immunization against influenza  Z23 Flu Vaccine QUAD 36+ mos IM  6. Former smoker  Z87.891 POCT Urinalysis Dipstick (Automated)    Meds ordered this encounter  Medications  . amphetamine-dextroamphetamine (ADDERALL) 20 MG tablet    Sig: Take 1 tablet (20 mg total) by mouth 3 (three) times daily.    Dispense:  90 tablet    Refill:  0  . amphetamine-dextroamphetamine (ADDERALL) 20 MG tablet    Sig: Take 1 tablet (20 mg total) by mouth 3 (three) times daily.    Dispense:  90 tablet    Refill:  0  . amphetamine-dextroamphetamine (ADDERALL) 20 MG tablet    Sig: Take 1 tablet (20 mg total) by  mouth 3 (three) times daily.    Dispense:  90 tablet    Refill:  0    Return precautions advised.  Garret Reddish, MD

## 2018-11-23 ENCOUNTER — Encounter: Payer: Self-pay | Admitting: Family Medicine

## 2018-11-23 ENCOUNTER — Ambulatory Visit (INDEPENDENT_AMBULATORY_CARE_PROVIDER_SITE_OTHER): Payer: 59 | Admitting: Family Medicine

## 2018-11-23 VITALS — BP 116/58 | HR 94 | Temp 98.2°F | Ht 64.0 in | Wt 115.2 lb

## 2018-11-23 DIAGNOSIS — Z0000 Encounter for general adult medical examination without abnormal findings: Secondary | ICD-10-CM | POA: Diagnosis not present

## 2018-11-23 DIAGNOSIS — F988 Other specified behavioral and emotional disorders with onset usually occurring in childhood and adolescence: Secondary | ICD-10-CM | POA: Diagnosis not present

## 2018-11-23 DIAGNOSIS — Z79899 Other long term (current) drug therapy: Secondary | ICD-10-CM | POA: Diagnosis not present

## 2018-11-23 DIAGNOSIS — E785 Hyperlipidemia, unspecified: Secondary | ICD-10-CM

## 2018-11-23 DIAGNOSIS — Z23 Encounter for immunization: Secondary | ICD-10-CM | POA: Diagnosis not present

## 2018-11-23 DIAGNOSIS — Z87891 Personal history of nicotine dependence: Secondary | ICD-10-CM

## 2018-11-23 LAB — POC URINALSYSI DIPSTICK (AUTOMATED)
Bilirubin, UA: NEGATIVE
Blood, UA: NEGATIVE
Glucose, UA: NEGATIVE
Ketones, UA: POSITIVE
Leukocytes, UA: NEGATIVE
Nitrite, UA: NEGATIVE
Protein, UA: NEGATIVE
Spec Grav, UA: 1.02 (ref 1.010–1.025)
Urobilinogen, UA: 0.2 E.U./dL
pH, UA: 6 (ref 5.0–8.0)

## 2018-11-23 LAB — COMPREHENSIVE METABOLIC PANEL
ALT: 15 U/L (ref 0–35)
AST: 19 U/L (ref 0–37)
Albumin: 4.5 g/dL (ref 3.5–5.2)
Alkaline Phosphatase: 54 U/L (ref 39–117)
BUN: 12 mg/dL (ref 6–23)
CO2: 29 mEq/L (ref 19–32)
Calcium: 9.5 mg/dL (ref 8.4–10.5)
Chloride: 104 mEq/L (ref 96–112)
Creatinine, Ser: 0.7 mg/dL (ref 0.40–1.20)
GFR: 92.57 mL/min (ref >60.00)
Glucose, Bld: 62 mg/dL — ABNORMAL LOW (ref 70–99)
Potassium: 4.4 mEq/L (ref 3.5–5.1)
Sodium: 140 mEq/L (ref 135–145)
Total Bilirubin: 0.4 mg/dL (ref 0.2–1.2)
Total Protein: 6.6 g/dL (ref 6.0–8.3)

## 2018-11-23 LAB — CBC WITH DIFFERENTIAL/PLATELET
Basophils Absolute: 0.1 10*3/uL (ref 0.0–0.1)
Basophils Relative: 0.6 % (ref 0.0–3.0)
Eosinophils Absolute: 0.2 10*3/uL (ref 0.0–0.7)
Eosinophils Relative: 2.2 % (ref 0.0–5.0)
HCT: 39.2 % (ref 36.0–46.0)
Hemoglobin: 13.1 g/dL (ref 12.0–15.0)
Lymphocytes Relative: 22.8 % (ref 12.0–46.0)
Lymphs Abs: 1.9 10*3/uL (ref 0.7–4.0)
MCHC: 33.4 g/dL (ref 30.0–36.0)
MCV: 93 fl (ref 78.0–100.0)
Monocytes Absolute: 0.9 10*3/uL (ref 0.1–1.0)
Monocytes Relative: 10.4 % (ref 3.0–12.0)
Neutro Abs: 5.3 10*3/uL (ref 1.4–7.7)
Neutrophils Relative %: 64 % (ref 43.0–77.0)
Platelets: 326 10*3/uL (ref 150.0–400.0)
RBC: 4.22 Mil/uL (ref 3.87–5.11)
RDW: 12.4 % (ref 11.5–15.5)
WBC: 8.2 10*3/uL (ref 4.0–10.5)

## 2018-11-23 MED ORDER — AMPHETAMINE-DEXTROAMPHETAMINE 20 MG PO TABS: 20.0000 mg | ORAL_TABLET | Freq: Three times a day (TID) | ORAL | 0 refills | Status: DC

## 2018-11-23 NOTE — Addendum Note (Signed)
Addended by: Francis Dowse T on: 11/23/2018 09:22 AM   Modules accepted: Orders

## 2018-11-25 LAB — PAIN MGMT, PROFILE 8 W/CONF, U
6 Acetylmorphine: NEGATIVE ng/mL
Alcohol Metabolites: POSITIVE ng/mL — AB (ref <500)
Amphetamine: 8640 ng/mL
Amphetamines: POSITIVE ng/mL
Benzodiazepines: NEGATIVE ng/mL
Buprenorphine, Urine: NEGATIVE ng/mL
Cocaine Metabolite: NEGATIVE ng/mL
Creatinine: 165 mg/dL
Ethyl Glucuronide (ETG): 16421 ng/mL
Ethyl Sulfate (ETS): 5138 ng/mL
MDMA: NEGATIVE ng/mL
Marijuana Metabolite: NEGATIVE ng/mL
Methamphetamine: NEGATIVE ng/mL
Opiates: NEGATIVE ng/mL
Oxidant: NEGATIVE ug/mL
Oxycodone: NEGATIVE ng/mL
pH: 6.7 (ref 4.5–9.0)

## 2019-01-07 ENCOUNTER — Ambulatory Visit: Payer: 59 | Attending: Internal Medicine

## 2019-01-07 DIAGNOSIS — Z20822 Contact with and (suspected) exposure to covid-19: Secondary | ICD-10-CM

## 2019-01-08 LAB — NOVEL CORONAVIRUS, NAA: SARS-CoV-2, NAA: NOT DETECTED

## 2019-03-11 ENCOUNTER — Telehealth: Payer: Self-pay

## 2019-03-11 ENCOUNTER — Other Ambulatory Visit: Payer: Self-pay

## 2019-03-11 MED ORDER — AMPHETAMINE-DEXTROAMPHETAMINE 20 MG PO TABS: 20.0000 mg | ORAL_TABLET | Freq: Three times a day (TID) | ORAL | 0 refills | Status: DC

## 2019-03-11 NOTE — Telephone Encounter (Signed)
LAST APPOINTMENT DATE: @11 /20/2020  NEXT APPOINTMENT DATE:@5 /20/2021   LAST REFILL: 02/03/2018  QTY: 30

## 2019-03-11 NOTE — Addendum Note (Signed)
Addended by: Shelva Majestic on: 03/11/2019 08:55 PM   Modules accepted: Orders

## 2019-03-11 NOTE — Telephone Encounter (Signed)
  LAST APPOINTMENT DATE: 11/23/2018   NEXT APPOINTMENT DATE:@5 /20/2021  MEDICATION:amphetamine-dextroamphetamine (ADDERALL) 20 MG tablet  PHARMACY: [id number] - [place], [place] - [address] AT [facility] OF [address] Phone:  [phone]  Fax:  [phone]       **Let patient know to contact pharmacy at the end of the day to make sure medication is ready. **  ** Please notify patient to allow 48-72 hours to process**  **Encourage patient to contact the pharmacy for refills or they can request refills through [facility]**  CLINICAL FILLS OUT ALL BELOW:   LAST REFILL:  QTY:  REFILL DATE:    OTHER COMMENTS:    Okay for refill?  Please advise

## 2019-03-11 NOTE — Telephone Encounter (Signed)
Meds were not loaded-I refilled 1 month

## 2019-04-15 ENCOUNTER — Other Ambulatory Visit: Payer: Self-pay | Admitting: Family Medicine

## 2019-04-15 MED ORDER — AMPHETAMINE-DEXTROAMPHETAMINE 20 MG PO TABS: 20.0000 mg | ORAL_TABLET | Freq: Three times a day (TID) | ORAL | 0 refills | Status: DC

## 2019-04-15 NOTE — Telephone Encounter (Signed)
MEDICATION: Adderall 20 MG  PHARMACY: [facility] [address]  Comments:   **Let patient know to contact pharmacy at the end of the day to make sure medication is ready. **  ** Please notify patient to allow 48-72 hours to process**  **Encourage patient to contact the pharmacy for refills or they can request refills through [facility]**

## 2019-04-15 NOTE — Telephone Encounter (Signed)
Medication sent to Dr. Durene Cal to be filled.

## 2019-05-09 ENCOUNTER — Other Ambulatory Visit: Payer: Self-pay | Admitting: Family Medicine

## 2019-05-09 NOTE — Telephone Encounter (Signed)
Patient calling to get a refill on the Adderall 20mg . [facility] [address]jk

## 2019-05-10 MED ORDER — AMPHETAMINE-DEXTROAMPHETAMINE 20 MG PO TABS: 20.0000 mg | ORAL_TABLET | Freq: Three times a day (TID) | ORAL | 0 refills | Status: DC

## 2019-05-10 NOTE — Telephone Encounter (Signed)
Sent to [facility] for refill approval.

## 2019-05-21 NOTE — Progress Notes (Signed)
Phone [phone] Virtual visit via Video note   Subjective:  Chief complaint: Chief Complaint  Patient presents with  . ADHD  . Sinus Problem    x 3 weeks     This visit type was conducted due to national recommendations for restrictions regarding the COVID-19 Pandemic (e.g. social distancing).  This format is felt to be most appropriate for this patient at this time balancing risks to patient and risks to population by having him in for in person visit.  No physical exam was performed (except for noted visual exam or audio findings with Telehealth visits).    Our team/I connected with Hayley Miranda at  8:40 AM EDT by a video enabled telemedicine application (doxy.me or caregility through epic) and verified that I am speaking with the correct person using two identifiers.  Location patient: Home[place] Location provider: [facility], office Persons participating in the virtual visit:  patient  Our team/I discussed the limitations of evaluation and management by telemedicine and the availability of in person appointments. In light of current covid-19 pandemic, patient also understands that we are trying to protect them by minimizing in office contact if at all possible.  The patient expressed consent for telemedicine visit and agreed to proceed. Patient understands insurance will be billed.   Past Medical History-  Patient Active Problem List   Diagnosis Date Noted  . CIGARETTE SMOKER 03/03/2008    Priority: High  . Attention deficit disorder (ADD) in adult 02/01/2007    Priority: Medium  . Onychomycosis 03/12/2015    Priority: Low  . 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 Allergic rhinitis 03/03/2008    Priority: Low    Medications- reviewed and updated Current Outpatient Medications  Medication Sig Dispense Refill  . amphetamine-dextroamphetamine (ADDERALL) 20 MG tablet Take 1 tablet (20 mg total) by mouth 3  (three) times daily. May fill today. 90 tablet 0  . fexofenadine-pseudoephedrine (ALLEGRA-D 24) 180-240 MG 24 hr tablet Take 1 tablet by mouth as needed.    Marland Kitchen levonorgestrel (MIRENA) 20 MCG/24HR IUD 1 each by Intrauterine route once.    Marland Kitchen amoxicillin-clavulanate (AUGMENTIN) 875-125 MG tablet Take 1 tablet by mouth 2 (two) times daily for 7 days. 14 tablet 0  . amphetamine-dextroamphetamine (ADDERALL) 20 MG tablet Take 1 tablet (20 mg total) by mouth in the morning, at noon, and at bedtime. 90 tablet 0  . [START ON 06/22/2019] amphetamine-dextroamphetamine (ADDERALL) 20 MG tablet Take 1 tablet (20 mg total) by mouth in the morning, at noon, and at bedtime. 90 tablet 0  . [START ON 07/22/2019] amphetamine-dextroamphetamine (ADDERALL) 20 MG tablet Take 1 tablet (20 mg total) by mouth in the morning, at noon, and at bedtime. 90 tablet 0   No current facility-administered medications for this visit.     Objective:  Temp 98.6 F (37 C) (Temporal)   Ht 5\' 4"  (1.626 m)   Wt 115 lb (52.2 kg)   BMI 19.74 kg/m  self reported vitals Gen: NAD, resting comfortably Lungs: nonlabored, normal respiratory rate  Skin: appears dry, no obvious rash     Assessment and Plan  6 month F/U ADD S: patient reports reasonable control with  adderrall 20mg  TID.  Since the last visit has the patient had any:  Appetite changes? No Unintentional weight loss? No Is medication working well ? Yes Does patient take drug holidays? No Difficulties falling to sleep or maintaining sleep? No Any anxiety?  No  Any cardiac issues (fainting or paliptations)? No Suicidal thoughts? No Changes in health since last visit? No New medications? No Any illicit substance abuse? No Has the patient taken his medication today? No  A/P: ADD is well controlled.  Refilled Adderall 20 mg 3 times daily for 3 months-she will call in in 3 months or send [place] a MyChart message and we can refill for an additional 3 months-then she needs to see  [place] in 6 months -UDS normal 11/23/2018 physical -PDMP reviewed today - no concerning trends -  Controlled substance contract  09/10/14  # Sinus pain  S:Has been doing renovations around home. Has been having pain and pressure in face with right ear pain. She has had drainage on right side with some cough. Denies any fever or loss of taste or smell. 3 weeks of symptoms. Mild sore throat. Allegra D and mucinex help. Green/yellow discharge. Has also had some nosebleeds. Has some cough, congestion, runny nose, Worsened this week in particular on Sunday.   No fever, chills,  shortness of breath, fatigue, body aches, headache, nausea, vomiting, diarrhea, or new loss of taste or smell. No known contacts with covid 19 or someone being tested for covid 19.   Several people in her office with head cold. She is not vaccinated.  A/P: 39 year old female with sinus congestion and discharge for 3 weeks now-concerning for bacterial sinusitis.  We are going to treat her with Augmentin for 7 days.  She may also have some underlying allergies and I want her to try Allegra without decongestant for the next month - if still has lingering symptoms 1 month would try flonase over the counter  Patient with symptoms concerning for potential covid 19 Therefore: -information provided on testing scheduling "please text "COVID" to [phone], OR you can log on to [email or web address] to easily make an on-line appointment. "  - recommended patient watch closely for shortness of breath or confusion or worsening symptoms and if those occur he should contact us immediately  -recommended patient consider purchasing pulse oximeter and if levels 94% or below persistently- seek care at the hospital  -recommended self isolation until negative test  at minimum .   Recommended follow up: Return in about 6 months (around 11/23/2019) for physical or sooner if needed. Future Appointments  Date Time Provider Silt  08/15/2019   9:00 AM Salvadore Dom, MD [place] None    Lab/Order associations:   ICD-10-CM   1. Attention deficit disorder (ADD) in adult  F98.8     Meds ordered this encounter  Medications  . amphetamine-dextroamphetamine (ADDERALL) 20 MG tablet    Sig: Take 1 tablet (20 mg total) by mouth in the morning, at noon, and at bedtime.    Dispense:  90 tablet    Refill:  0  . amphetamine-dextroamphetamine (ADDERALL) 20 MG tablet    Sig: Take 1 tablet (20 mg total) by mouth in the morning, at noon, and at bedtime.    Dispense:  90 tablet    Refill:  0  . amphetamine-dextroamphetamine (ADDERALL) 20 MG tablet    Sig: Take 1 tablet (20 mg total) by mouth in the morning, at noon, and at bedtime.    Dispense:  90 tablet    Refill:  0  . amoxicillin-clavulanate (AUGMENTIN) 875-125 MG tablet    Sig: Take 1 tablet by mouth 2 (two) times daily for 7 days.    Dispense:  14 tablet    Refill:  0   Return precautions  advised.  Garret Reddish, MD

## 2019-05-21 NOTE — Patient Instructions (Addendum)
Health Maintenance Due  Topic Date Due  . COVID-19 Vaccine (1) has not had - recommended get this once she is feeling better Never done  . PAP SMEAR-Modifier  Has app in Aug with GYN  03/29/2019

## 2019-05-23 ENCOUNTER — Encounter: Payer: Self-pay | Admitting: Family Medicine

## 2019-05-23 ENCOUNTER — Telehealth (INDEPENDENT_AMBULATORY_CARE_PROVIDER_SITE_OTHER): Payer: 59 | Admitting: Family Medicine

## 2019-05-23 ENCOUNTER — Other Ambulatory Visit: Payer: Self-pay

## 2019-05-23 VITALS — Temp 98.6°F | Ht 64.0 in | Wt 115.0 lb

## 2019-05-23 DIAGNOSIS — F988 Other specified behavioral and emotional disorders with onset usually occurring in childhood and adolescence: Secondary | ICD-10-CM | POA: Diagnosis not present

## 2019-05-23 MED ORDER — AMPHETAMINE-DEXTROAMPHETAMINE 20 MG PO TABS
20.0000 mg | ORAL_TABLET | Freq: Three times a day (TID) | ORAL | 0 refills | Status: DC
Start: 2019-07-22 — End: 2019-12-24

## 2019-05-23 MED ORDER — AMOXICILLIN-POT CLAVULANATE 875-125 MG PO TABS: 1.0000 | ORAL_TABLET | Freq: Two times a day (BID) | ORAL | 0 refills | Status: AC

## 2019-05-23 MED ORDER — AMPHETAMINE-DEXTROAMPHETAMINE 20 MG PO TABS
20.0000 mg | ORAL_TABLET | Freq: Three times a day (TID) | ORAL | 0 refills | Status: DC
Start: 2019-05-23 — End: 2019-09-04

## 2019-05-23 MED ORDER — AMPHETAMINE-DEXTROAMPHETAMINE 20 MG PO TABS
20.0000 mg | ORAL_TABLET | Freq: Three times a day (TID) | ORAL | 0 refills | Status: DC
Start: 2019-06-22 — End: 2019-09-04

## 2019-08-14 NOTE — Progress Notes (Deleted)
39 y.o. G36P1001 Single White or Caucasian Not Hispanic or Latino female here for annual exam.      No LMP recorded. (Menstrual status: IUD).          Sexually active: {yes no:314532}  The current method of family planning is IUD.    Exercising: {yes no:314532}  {types:19826} Smoker:  {YES NO:22349}  Health Maintenance: Pap:  03-28-16 neg HPV HR neg History of abnormal Pap:  yes MMG:  04-01-2015 bilateral & left breast u/s category c density birads 1:neg BMD:   none Colonoscopy: none TDaP:  2017 Gardasil: ***   reports that she quit smoking about a year ago. Her smoking use included cigarettes. She smoked 0.25 packs per day. She has never used smokeless tobacco. She reports current alcohol use of about 6.0 standard drinks of alcohol per week. She reports that she does not use drugs.  Past Medical History:  Diagnosis Date  . Abnormal Pap smear of cervix   . Blood transfusion   . Blood transfusion without reported diagnosis   . CELLULITIS, FOOT, LEFT 09/16/2008  . Dermatophytosis of nail 02/01/2007       . STD (sexually transmitted disease) 06/2009   trichamonus    Past Surgical History:  Procedure Laterality Date  . COLPOSCOPY    . FOOT SURGERY Right 2010  . INTRAUTERINE DEVICE (IUD) INSERTION  03/2016   Mirena     Current Outpatient Medications  Medication Sig Dispense Refill  . amphetamine-dextroamphetamine (ADDERALL) 20 MG tablet Take 1 tablet (20 mg total) by mouth 3 (three) times daily. May fill today. 90 tablet 0  . amphetamine-dextroamphetamine (ADDERALL) 20 MG tablet Take 1 tablet (20 mg total) by mouth in the morning, at noon, and at bedtime. 90 tablet 0  . amphetamine-dextroamphetamine (ADDERALL) 20 MG tablet Take 1 tablet (20 mg total) by mouth in the morning, at noon, and at bedtime. 90 tablet 0  . amphetamine-dextroamphetamine (ADDERALL) 20 MG tablet Take 1 tablet (20 mg total) by mouth in the morning, at noon, and at bedtime. 90 tablet 0  .  fexofenadine-pseudoephedrine (ALLEGRA-D 24) 180-240 MG 24 hr tablet Take 1 tablet by mouth as needed.    Marland Kitchen levonorgestrel (MIRENA) 20 MCG/24HR IUD 1 each by Intrauterine route once.     No current facility-administered medications for this visit.    Family History  Problem Relation Age of Onset  . Hyperlipidemia Father        fhx  . Hypertension Father        fhx  . Cancer Other        prostate/fhx  . Osteoporosis Mother   . Lung cancer Maternal Grandmother   . Lung cancer Paternal Grandmother   . Lung cancer Paternal Grandfather   . Liver cancer Maternal Grandfather     Review of Systems  Exam:   There were no vitals taken for this visit.  Weight change: @WEIGHTCHANGE @ Height:      Ht Readings from Last 3 Encounters:  05/23/19 5\' 4"  (1.626 m)  11/23/18 5\' 4"  (1.626 m)  08/08/18 5' 3.5" (1.613 m)    General appearance: alert, cooperative and appears stated age Head: Normocephalic, without obvious abnormality, atraumatic Neck: no adenopathy, supple, symmetrical, trachea midline and thyroid {CHL AMB PHY EX THYROID NORM DEFAULT:[phone]::"normal to inspection and palpation"} Lungs: clear to auscultation bilaterally Cardiovascular: regular rate and rhythm Breasts: {Exam; breast:13139::"normal appearance, no masses or tenderness"} Abdomen: soft, non-tender; non distended,  no masses,  no organomegaly Extremities: extremities normal, atraumatic, no  cyanosis or edema Skin: Skin color, texture, turgor normal. No rashes or lesions Lymph nodes: Cervical, supraclavicular, and axillary nodes normal. No abnormal inguinal nodes palpated Neurologic: Grossly normal   Pelvic: External genitalia:  no lesions              Urethra:  normal appearing urethra with no masses, tenderness or lesions              Bartholins and Skenes: normal                 Vagina: normal appearing vagina with normal color and discharge, no lesions              Cervix: {CHL AMB PHY EX CERVIX NORM  DEFAULT:[phone]::"no lesions"}               Bimanual Exam:  Uterus:  {CHL AMB PHY EX UTERUS NORM [phone]::"normal size, contour, position, consistency, mobility, non-tender"}              Adnexa: {CHL AMB PHY EX ADNEXA NO MASS DEFAULT:[phone]::"no mass, fullness, tenderness"}               Rectovaginal: Confirms               Anus:  normal sphincter tone, no lesions  *** chaperoned for the exam.  A:  Well Woman with normal exam  P:

## 2019-08-15 ENCOUNTER — Ambulatory Visit: Payer: 59 | Admitting: Obstetrics and Gynecology

## 2019-08-15 ENCOUNTER — Encounter: Payer: Self-pay | Admitting: Obstetrics and Gynecology

## 2019-09-03 ENCOUNTER — Other Ambulatory Visit: Payer: Self-pay

## 2019-09-03 NOTE — Telephone Encounter (Signed)
..   LAST APPOINTMENT DATE: 05/09/2019   NEXT APPOINTMENT DATE:@12 /20/2021  MEDICATION:amphetamine-dextroamphetamine (ADDERALL) 20 MG tablet    PHARMACY:[id number] - [place], [place] - [address] AT [facility] OF [address]

## 2019-09-03 NOTE — Telephone Encounter (Signed)
Last appt 05/09/19  Next appt 12/23/19  Adderall 20 mg

## 2019-09-03 NOTE — Telephone Encounter (Signed)
Please load rx as rx request and I can refill.

## 2019-09-04 MED ORDER — AMPHETAMINE-DEXTROAMPHETAMINE 20 MG PO TABS: 20.0000 mg | ORAL_TABLET | Freq: Three times a day (TID) | ORAL | 0 refills | Status: DC

## 2019-09-04 NOTE — Telephone Encounter (Signed)
Rx loaded and sent to Dr. Durene Cal for fill.

## 2019-12-02 ENCOUNTER — Other Ambulatory Visit: Payer: Self-pay

## 2019-12-02 MED ORDER — AMPHETAMINE-DEXTROAMPHETAMINE 20 MG PO TABS: 20.0000 mg | ORAL_TABLET | Freq: Three times a day (TID) | ORAL | 0 refills | Status: DC

## 2019-12-02 NOTE — Telephone Encounter (Signed)
.. °  LAST APPOINTMENT DATE: 09/03/2019   NEXT APPOINTMENT DATE:@12 /20/2021  MEDICATION:amphetamine-dextroamphetamine (ADDERALL) 20 MG tablet

## 2019-12-02 NOTE — Telephone Encounter (Signed)
Needs to keep upcoming appointment. 

## 2019-12-02 NOTE — Telephone Encounter (Signed)
LR: 10-04-2019 Qty: 90 with 0 refills  Last office visit: 05-23-2019 Upcoming appointment: 12-23-2019

## 2019-12-19 NOTE — Patient Instructions (Signed)
Health Maintenance Due  Topic Date Due   COVID-19 Vaccine (1) Never done   PAP SMEAR-Modifier  03/29/2019   INFLUENZA VACCINE  08/04/2019   Depression screen [facility] 2/9 05/23/2019 05/24/2018 11/21/2017  Decreased Interest 0 0 0  Down, Depressed, Hopeless 0 0 0  PHQ - 2 Score 0 0 0

## 2019-12-19 NOTE — Progress Notes (Signed)
   Patient no showed for physical/medication follow-up for ADD-rescheduled for next day

## 2019-12-23 ENCOUNTER — Ambulatory Visit: Payer: 59 | Admitting: Family Medicine

## 2019-12-23 NOTE — Progress Notes (Signed)
Phone [phone] Virtual visit via Video note   Subjective:  Chief complaint: Chief Complaint  Patient presents with  . ADD   This visit type was conducted due to national recommendations for restrictions regarding the COVID-19 Pandemic (e.g. social distancing).  This format is felt to be most appropriate for this patient at this time balancing risks to patient and risks to population by having him in for in person visit.  No physical exam was performed (except for noted visual exam or audio findings with Telehealth visits).    Our team/I connected with Hayley Miranda at  9:20 AM EST by a video enabled telemedicine application (doxy.me or caregility through epic) and verified that I am speaking with the correct person using two identifiers.  Location patient: [place] Location provider: [facility], office Persons participating in the virtual visit:  patient  Our team/I discussed the limitations of evaluation and management by telemedicine and the availability of in person appointments. In light of current covid-19 pandemic, patient also understands that we are trying to protect them by minimizing in office contact if at all possible.  The patient expressed consent for telemedicine visit and agreed to proceed. Patient understands insurance will be billed.   Past Medical History-  Patient Active Problem List   Diagnosis Date Noted  . Former smoker 03/03/2008    Priority: High  . Attention deficit disorder (ADD) in adult 02/01/2007    Priority: Medium  . Onychomycosis 03/12/2015    Priority: Low  . 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 Allergic rhinitis 03/03/2008    Priority: Low    Medications- reviewed and updated Current Outpatient Medications  Medication Sig Dispense Refill  . fexofenadine-pseudoephedrine (ALLEGRA-D 24) 180-240 MG 24 hr tablet Take 1 tablet by mouth as needed.    Marland Kitchen levonorgestrel (MIRENA)  20 MCG/24HR IUD 1 each by Intrauterine route once.    Marland Kitchen amphetamine-dextroamphetamine (ADDERALL) 20 MG tablet Take 1 tablet (20 mg total) by mouth 3 (three) times daily. May fill today. 90 tablet 0  . amphetamine-dextroamphetamine (ADDERALL) 20 MG tablet Take 1 tablet (20 mg total) by mouth 3 (three) times daily. May fill in 1 month 90 tablet 0  . amphetamine-dextroamphetamine (ADDERALL) 20 MG tablet Take 1 tablet (20 mg total) by mouth 3 (three) times daily. May fill in 2 months 90 tablet 0   No current facility-administered medications for this visit.     Objective:  no self reported vitals Gen: NAD, resting comfortably Lungs: nonlabored, normal respiratory rate  Skin: appears dry, no obvious rash     Assessment and Plan  # ADD S:control: reasonable control medication: Adderall 20 mg 3 times daily (reports likely needs prior auth) Controlled substance contract: 09/10/2014 UDS: Plan December 23, 2019- ordered and she will come by for this NCCSRS/PDMP reviewed:  Original diagnosis: 6 th grade-and had been treated by Dr. Lovell Sheehan through 2015 Since the last visit has the patient had any: Birth control: IUD   Appetite changes? No Unintentional weight loss?No Is medication working well ? Yes Does patient take drug holidays? No Difficulties falling to sleep or maintaining sleep? No Any anxiety?  No Any cardiac issues (fainting or paliptations)?No Suicidal thoughts? No Changes in health since last visit? No New medications? No Any illicit substance abuse? No A/P: ADD appears well controlled-refilled medications.  She may need prior authorization. Prior trials of ritalin, dexedrine, vyvanse without success- see overview for ADD  Recommended follow  up:  kkeep physical in april Future Appointments  Date Time Provider Department Center  04/15/2020  8:20 AM Shelva Majestic, MD [facility]    Lab/Order associations:   ICD-10-CM   1. High risk medication use  Z79.899 DRUG  MONITORING, PANEL 8 WITH CONFIRMATION, URINE    DRUG MONITORING, PANEL 8 WITH CONFIRMATION, URINE  2. Attention deficit disorder (ADD) in adult  F98.8 DRUG MONITORING, PANEL 8 WITH CONFIRMATION, URINE    DRUG MONITORING, PANEL 8 WITH CONFIRMATION, URINE    Meds ordered this encounter  Medications  . amphetamine-dextroamphetamine (ADDERALL) 20 MG tablet    Sig: Take 1 tablet (20 mg total) by mouth 3 (three) times daily. May fill today.    Dispense:  90 tablet    Refill:  0    Send prior authorization form if needed  . amphetamine-dextroamphetamine (ADDERALL) 20 MG tablet    Sig: Take 1 tablet (20 mg total) by mouth 3 (three) times daily. May fill in 1 month    Dispense:  90 tablet    Refill:  0  . amphetamine-dextroamphetamine (ADDERALL) 20 MG tablet    Sig: Take 1 tablet (20 mg total) by mouth 3 (three) times daily. May fill in 2 months    Dispense:  90 tablet    Refill:  0    Return precautions advised.  Tana Conch, MD

## 2019-12-23 NOTE — Patient Instructions (Addendum)
Health Maintenance Due  Topic Date Due  . COVID-19 Vaccine (1) Has not received her covid vaccine. Encouraged her to complete Never done  . PAP SMEAR- HPV negative so space to 5 years 03/29/2019  . INFLUENZA VACCINE schedule either nurse visit here or with pharmacy 08/04/2019   Please schedule lab visit to come by for UDS

## 2019-12-24 ENCOUNTER — Other Ambulatory Visit: Payer: Self-pay

## 2019-12-24 ENCOUNTER — Telehealth (INDEPENDENT_AMBULATORY_CARE_PROVIDER_SITE_OTHER): Payer: 59 | Admitting: Family Medicine

## 2019-12-24 ENCOUNTER — Encounter: Payer: Self-pay | Admitting: Family Medicine

## 2019-12-24 DIAGNOSIS — F988 Other specified behavioral and emotional disorders with onset usually occurring in childhood and adolescence: Secondary | ICD-10-CM

## 2019-12-24 DIAGNOSIS — Z79899 Other long term (current) drug therapy: Secondary | ICD-10-CM | POA: Diagnosis not present

## 2019-12-24 MED ORDER — AMPHETAMINE-DEXTROAMPHETAMINE 20 MG PO TABS
20.0000 mg | ORAL_TABLET | Freq: Three times a day (TID) | ORAL | 0 refills | Status: DC
Start: 2019-12-24 — End: 2020-04-15

## 2019-12-24 MED ORDER — AMPHETAMINE-DEXTROAMPHETAMINE 20 MG PO TABS: 20.0000 mg | ORAL_TABLET | Freq: Three times a day (TID) | ORAL | 0 refills | Status: DC

## 2020-04-14 NOTE — Patient Instructions (Addendum)
Please stop by lab before you go If you have mychart- we will send your results within 3 business days of [place] receiving them.  If you do not have mychart- we will call you about results within 5 business days of [place] receiving them.  *please also note that you will see labs on mychart as soon as they post. I will later go in and write notes on them- will say "notes from Dr. Durene Cal"  Health Maintenance Due  Topic Date Due  . COVID-19 Vaccine (2 - Pfizer 3-dose series) Patient is scheduled this Saturday for her 2nd dosage.  04/18/2020   [facility] Schedule an appointment by calling [phone].  # Right 2nd toe pain-patient with a bunion on his foot and has some pain at the MTP joint of the second toe-I think she has capsulitis-occurred after playing soccer for the first time recently.  We are going to refer to podiatry for their opinion-she can try Aleve twice a day for 7 days to see if that helps with the discomfort  We will call you within two weeks about your referral to podiatry. If you do not hear within 2 weeks, give [place] a call.    Recommended follow up: Return in about 6 months (around 10/15/2020) for follow up- or sooner if needed.

## 2020-04-14 NOTE — Progress Notes (Signed)
Phone [phone]   Subjective:  Patient presents today for their annual physical. Chief complaint-noted.   See problem oriented charting- ROS- full  review of systems was completed and negative except for: sinus pressure/allergies  The following were reviewed and entered/updated in epic: Past Medical History:  Diagnosis Date  . Abnormal Pap smear of cervix   . Blood transfusion   . Blood transfusion without reported diagnosis   . CELLULITIS, FOOT, LEFT 09/16/2008  . Dermatophytosis of nail 02/01/2007       . STD (sexually transmitted disease) 06/2009   trichamonus   Patient Active Problem List   Diagnosis Date Noted  . Former smoker 03/03/2008    Priority: High  . Attention deficit disorder (ADD) in adult 02/01/2007    Priority: Medium  . Onychomycosis 03/12/2015    Priority: Low  . Allergic rhinitis 03/03/2008    Priority: Low   Past Surgical History:  Procedure Laterality Date  . COLPOSCOPY    . FOOT SURGERY Right 2010  . INTRAUTERINE DEVICE (IUD) INSERTION  03/2016   Mirena     Family History  Problem Relation Age of Onset  . Hyperlipidemia Father        fhx  . Hypertension Father        fhx  . Cancer Other        prostate/fhx  . Osteoporosis Mother   . Lung cancer Maternal Grandmother   . Lung cancer Paternal Grandmother   . Lung cancer Paternal Grandfather   . Liver cancer Maternal Grandfather     Medications- reviewed and updated Current Outpatient Medications  Medication Sig Dispense Refill  . fexofenadine-pseudoephedrine (ALLEGRA-D 24) 180-240 MG 24 hr tablet Take 1 tablet by mouth as needed.    Marland Kitchen levonorgestrel (MIRENA) 20 MCG/24HR IUD 1 each by Intrauterine route once.    Marland Kitchen amphetamine-dextroamphetamine (ADDERALL) 20 MG tablet Take 1 tablet (20 mg total) by mouth 3 (three) times daily. May fill today. 90 tablet 0  . amphetamine-dextroamphetamine (ADDERALL) 20 MG tablet Take 1 tablet (20 mg total) by mouth 3 (three) times daily. May fill in 1  month 90 tablet 0  . amphetamine-dextroamphetamine (ADDERALL) 20 MG tablet Take 1 tablet (20 mg total) by mouth 3 (three) times daily. May fill in 2 months 90 tablet 0   No current facility-administered medications for this visit.    Allergies-reviewed and updated Allergies  Allergen Reactions  . Sulfamethoxazole-Trimethoprim     REACTION: rash--fever    Social History   Social History Narrative   Divorced-currently dating.  Has 1 daughter from prior marriage   Objective  Objective:  BP 124/76   Pulse 86   Temp 98.3 F (36.8 C) (Temporal)   Ht 5\' 4"  (1.626 m)   Wt 114 lb 3.2 oz (51.8 kg)   SpO2 98%   BMI 19.60 kg/m  Gen: NAD, resting comfortably HEENT: Mucous membranes are moist. Oropharynx normal Neck: no thyromegaly CV: RRR no murmurs rubs or gallops Lungs: CTAB no crackles, wheeze, rhonchi Abdomen: soft/nontender/nondistended/normal bowel sounds. No rebound or guarding.  Ext: no edema Skin: warm, dry Neuro: grossly normal, moves all extremities, PERRLA    Assessment and Plan   40 y.o. female presenting for annual physical.  Health Maintenance counseling: 1. Anticipatory guidance: Patient counseled regarding regular dental exams - q6 months, eye exams - no vision issues in past, some changes to vision at night- recommended updated eye exam,  avoiding smoking and second hand smoke , limiting alcohol to 1 beverage per  day .   2. Risk factor reduction:  Advised patient of need for regular exercise and diet rich and fruits and vegetables to reduce risk of heart attack and stroke. Exercise- 5 days a week and indoor soccer and kickball. Diet- reasonably healthy diet- doing good jo maintaining weight- do not want her to lose more.  Wt Readings from Last 3 Encounters:  04/15/20 114 lb 3.2 oz (51.8 kg)  05/23/19 115 lb (52.2 kg)  11/23/18 115 lb 3.2 oz (52.3 kg)   3. Immunizations/screenings/ancillary studies- had first covid vaccine and is planning on getting the 2nd on  Saturday- will mychart [place] with 2nd date.  Immunization History  Administered Date(s) Administered  . Influenza Whole 11/02/2009  . Influenza,inj,Quad PF,6+ Mos 10/08/2012, 09/18/2013, 09/10/2014, 10/09/2015, 10/25/2016, 11/21/2017, 11/23/2018  . PFIZER(Purple Top)SARS-COV-2 Vaccination 03/28/2020  . Td 02/01/2007  . Tdap 06/10/2015   4. Cervical cancer screening- 03/28/16 with 5 year repeat with negative HPV 5. Breast cancer screening-  breast exam with GYN and mammogram - she is going to get this updated - ordered today 6. Colon cancer screening - no family history, start at age 24 7. Skin cancer screening- no regular dermatologistadvised regular sunscreen use. Denies worrisome, changing, or new skin lesions.  8. Birth control/STD check- Mirena. Long term relationship so declines STD testing.  9. Osteoporosis screening at 2- not at age yet- would screen postmenopause. Does protein shake with calcium and vitamin D as well -FORMER smoker- will get UA- not of age for screening for lung cancer screening  Status of chronic or acute concerns   # ADD S:control: reasonably controlled medication: Adderall 20 mg 3 times daily Controlled substance contract: 09/10/2014, updating today UDS: will update today NCCSRS/PDMP reviewed: low risk habits Original diagnosis: 6 th grade-and had been treated by Dr. Lovell Sheehan through 2015 Birth control: IUD   Since the last visit has the patient had any: Appetite changes? No Unintentional weight loss?No Is medication working well ? Yes Does patient take drug holidays? No Difficulties falling to sleep or maintaining sleep? No Any anxiety?  No Any cardiac issues (fainting or paliptations)?No Suicidal thoughts? No Changes in health since last visit? No New medications? No Any illicit substance abuse? No Has the patient taken his medication today? Not yet A/P: reasonable control- 3 month refill today . Can send in another 3 months prescription in 3 months and  then see each other in 6 months  # Right 2nd toe pain-patient with a bunion on his foot and has some pain at the MTP joint of the second toe-I think she has capsulitis-occurred after playing soccer for the first time recently.  We are going to refer to podiatry for their opinion-she can try Aleve twice a day for 7 days to see if that helps with the discomfort  # COPD- quit smoking by age 45. Prior diagnosis was in 2013 by former provider. This was a presumptive diagnosis with chronic coughand not diagnosed on PFTs or imaging- we opted to remove this from list. No chronic cough at this time- we will monitor for recurrence.   #screening hyperlipidemia- normal 2019- recheck 2024 Lab Results  Component Value Date   CHOL 153 03/29/2017   HDL 72 03/29/2017   LDLCALC 61 03/29/2017   TRIG 101 03/29/2017   CHOLHDL 2.1 03/29/2017   Recommended follow up: Return in about 6 months (around 10/15/2020) for follow up- or sooner if needed.  Lab/Order associations: NOT fasting   ICD-10-CM   1. Preventative health  care  Z00.00 POCT Urinalysis Dipstick (Automated)    CBC with Differential/Platelet    Comprehensive metabolic panel    DRUG MONITORING, PANEL 8 WITH CONFIRMATION, URINE  2. COPD (chronic obstructive pulmonary disease) with chronic bronchitis (HCC)  J44.9   3. Former smoker  Z87.891 POCT Urinalysis Dipstick (Automated)  4. Attention deficit disorder (ADD) in adult  F98.8 CBC with Differential/Platelet    Comprehensive metabolic panel    DRUG MONITORING, PANEL 8 WITH CONFIRMATION, URINE  5. High risk medication use  Z79.899 CBC with Differential/Platelet    Comprehensive metabolic panel    DRUG MONITORING, PANEL 8 WITH CONFIRMATION, URINE  6. Encounter for screening mammogram for malignant neoplasm of breast  Z12.31 MM Digital Screening  7. Pain in right toe(s)  M79.674 Ambulatory referral to Podiatry    Meds ordered this encounter  Medications  . amphetamine-dextroamphetamine (ADDERALL)  20 MG tablet    Sig: Take 1 tablet (20 mg total) by mouth 3 (three) times daily. May fill today.    Dispense:  90 tablet    Refill:  0    Send prior authorization form if needed  . amphetamine-dextroamphetamine (ADDERALL) 20 MG tablet    Sig: Take 1 tablet (20 mg total) by mouth 3 (three) times daily. May fill in 1 month    Dispense:  90 tablet    Refill:  0  . amphetamine-dextroamphetamine (ADDERALL) 20 MG tablet    Sig: Take 1 tablet (20 mg total) by mouth 3 (three) times daily. May fill in 2 months    Dispense:  90 tablet    Refill:  0    Return precautions advised.  Tana Conch, MD

## 2020-04-15 ENCOUNTER — Encounter: Payer: Self-pay | Admitting: Family Medicine

## 2020-04-15 ENCOUNTER — Other Ambulatory Visit: Payer: Self-pay

## 2020-04-15 ENCOUNTER — Ambulatory Visit (INDEPENDENT_AMBULATORY_CARE_PROVIDER_SITE_OTHER): Payer: No Typology Code available for payment source | Admitting: Family Medicine

## 2020-04-15 VITALS — BP 124/76 | HR 86 | Temp 98.3°F | Ht 64.0 in | Wt 114.2 lb

## 2020-04-15 DIAGNOSIS — Z87891 Personal history of nicotine dependence: Secondary | ICD-10-CM

## 2020-04-15 DIAGNOSIS — Z79899 Other long term (current) drug therapy: Secondary | ICD-10-CM | POA: Diagnosis not present

## 2020-04-15 DIAGNOSIS — M79674 Pain in right toe(s): Secondary | ICD-10-CM

## 2020-04-15 DIAGNOSIS — F988 Other specified behavioral and emotional disorders with onset usually occurring in childhood and adolescence: Secondary | ICD-10-CM | POA: Diagnosis not present

## 2020-04-15 DIAGNOSIS — Z1231 Encounter for screening mammogram for malignant neoplasm of breast: Secondary | ICD-10-CM

## 2020-04-15 DIAGNOSIS — Z0000 Encounter for general adult medical examination without abnormal findings: Secondary | ICD-10-CM

## 2020-04-15 DIAGNOSIS — J449 Chronic obstructive pulmonary disease, unspecified: Secondary | ICD-10-CM

## 2020-04-15 LAB — CBC WITH DIFFERENTIAL/PLATELET
Basophils Absolute: 0 10*3/uL (ref 0.0–0.1)
Basophils Relative: 0.7 % (ref 0.0–3.0)
Eosinophils Absolute: 0.2 10*3/uL (ref 0.0–0.7)
Eosinophils Relative: 3.7 % (ref 0.0–5.0)
HCT: 38.6 % (ref 36.0–46.0)
Hemoglobin: 13.1 g/dL (ref 12.0–15.0)
Lymphocytes Relative: 27.6 % (ref 12.0–46.0)
Lymphs Abs: 1.7 10*3/uL (ref 0.7–4.0)
MCHC: 33.8 g/dL (ref 30.0–36.0)
MCV: 91.7 fl (ref 78.0–100.0)
Monocytes Absolute: 0.6 10*3/uL (ref 0.1–1.0)
Monocytes Relative: 9.5 % (ref 3.0–12.0)
Neutro Abs: 3.5 10*3/uL (ref 1.4–7.7)
Neutrophils Relative %: 58.5 % (ref 43.0–77.0)
Platelets: 344 10*3/uL (ref 150.0–400.0)
RBC: 4.21 Mil/uL (ref 3.87–5.11)
RDW: 12.2 % (ref 11.5–15.5)
WBC: 6 10*3/uL (ref 4.0–10.5)

## 2020-04-15 LAB — POC URINALSYSI DIPSTICK (AUTOMATED)
Bilirubin, UA: NEGATIVE
Blood, UA: NEGATIVE
Glucose, UA: NEGATIVE
Ketones, UA: NEGATIVE
Leukocytes, UA: NEGATIVE
Nitrite, UA: NEGATIVE
Protein, UA: NEGATIVE
Spec Grav, UA: 1.01 (ref 1.010–1.025)
Urobilinogen, UA: 0.2 E.U./dL
pH, UA: 6 (ref 5.0–8.0)

## 2020-04-15 LAB — COMPREHENSIVE METABOLIC PANEL
ALT: 12 U/L (ref 0–35)
AST: 16 U/L (ref 0–37)
Albumin: 4.2 g/dL (ref 3.5–5.2)
Alkaline Phosphatase: 61 U/L (ref 39–117)
BUN: 7 mg/dL (ref 6–23)
CO2: 30 mEq/L (ref 19–32)
Calcium: 9.1 mg/dL (ref 8.4–10.5)
Chloride: 100 mEq/L (ref 96–112)
Creatinine, Ser: 0.69 mg/dL (ref 0.40–1.20)
GFR: 107.64 mL/min (ref >60.00)
Glucose, Bld: 74 mg/dL (ref 70–99)
Potassium: 4 mEq/L (ref 3.5–5.1)
Sodium: 135 mEq/L (ref 135–145)
Total Bilirubin: 0.4 mg/dL (ref 0.2–1.2)
Total Protein: 6.6 g/dL (ref 6.0–8.3)

## 2020-04-15 MED ORDER — AMPHETAMINE-DEXTROAMPHETAMINE 20 MG PO TABS: 20.0000 mg | ORAL_TABLET | Freq: Three times a day (TID) | ORAL | 0 refills | Status: DC

## 2020-04-15 NOTE — Addendum Note (Signed)
Addended by: Lurlean Horns on: 04/15/2020 09:36 AM   Modules accepted: Orders

## 2020-04-17 LAB — DRUG MONITORING, PANEL 8 WITH CONFIRMATION, URINE
6 Acetylmorphine: NEGATIVE ng/mL (ref <10)
Alcohol Metabolites: POSITIVE ng/mL — AB
Amphetamine: 3896 ng/mL — ABNORMAL HIGH (ref <250)
Amphetamines: POSITIVE ng/mL — AB (ref <500)
Benzodiazepines: NEGATIVE ng/mL (ref <100)
Buprenorphine, Urine: NEGATIVE ng/mL (ref <5)
Cocaine Metabolite: NEGATIVE ng/mL (ref <150)
Creatinine: 45.7 mg/dL
Ethyl Glucuronide (ETG): 15955 ng/mL — ABNORMAL HIGH (ref <500)
Ethyl Sulfate (ETS): 3556 ng/mL — ABNORMAL HIGH (ref <100)
MDMA: NEGATIVE ng/mL (ref <500)
Marijuana Metabolite: NEGATIVE ng/mL (ref <20)
Methamphetamine: NEGATIVE ng/mL (ref <250)
Opiates: NEGATIVE ng/mL (ref <100)
Oxidant: NEGATIVE ug/mL
Oxycodone: NEGATIVE ng/mL (ref <100)
pH: 6.5 (ref 4.5–9.0)

## 2020-04-17 LAB — DM TEMPLATE

## 2020-04-29 ENCOUNTER — Other Ambulatory Visit: Payer: Self-pay

## 2020-04-29 ENCOUNTER — Ambulatory Visit (INDEPENDENT_AMBULATORY_CARE_PROVIDER_SITE_OTHER): Payer: No Typology Code available for payment source

## 2020-04-29 ENCOUNTER — Ambulatory Visit: Payer: No Typology Code available for payment source | Admitting: Podiatry

## 2020-04-29 ENCOUNTER — Encounter: Payer: Self-pay | Admitting: Podiatry

## 2020-04-29 DIAGNOSIS — M21611 Bunion of right foot: Secondary | ICD-10-CM

## 2020-04-29 DIAGNOSIS — M2041 Other hammer toe(s) (acquired), right foot: Secondary | ICD-10-CM

## 2020-04-29 DIAGNOSIS — M79672 Pain in left foot: Secondary | ICD-10-CM

## 2020-04-29 DIAGNOSIS — M779 Enthesopathy, unspecified: Secondary | ICD-10-CM

## 2020-04-29 DIAGNOSIS — M79671 Pain in right foot: Secondary | ICD-10-CM

## 2020-04-29 DIAGNOSIS — M21612 Bunion of left foot: Secondary | ICD-10-CM | POA: Diagnosis not present

## 2020-04-29 DIAGNOSIS — M21619 Bunion of unspecified foot: Secondary | ICD-10-CM

## 2020-04-29 DIAGNOSIS — M778 Other enthesopathies, not elsewhere classified: Secondary | ICD-10-CM | POA: Diagnosis not present

## 2020-04-29 MED ORDER — TRIAMCINOLONE ACETONIDE 10 MG/ML IJ SUSP
10.0000 mg | Freq: Once | INTRAMUSCULAR | Status: AC
  Administered 2020-04-29: 10 mg

## 2020-05-01 NOTE — Progress Notes (Signed)
Subjective:   Patient ID: Hayley Miranda, female   DOB: 40 y.o.   MRN: [id number]   HPI Patient states that she has developed inflammation in her right foot and also she has structural bunion with digital deformities.  States its been sore for several months and making it hard to walk and she feels like she is compensating and patient does not smoke likes to be active   Review of Systems  All other systems reviewed and are negative.       Objective:  Physical Exam Vitals and nursing note reviewed.  Constitutional:      Appearance: She is well-developed.  Pulmonary:     Effort: Pulmonary effort is normal.  Musculoskeletal:        General: Normal range of motion.  Skin:    General: Skin is warm.  Neurological:     Mental Status: She is alert.     Neurovascular status intact muscle strength found to be adequate range of motion adequate.  Patient is found to have moderate structural deformity right over left with prominence around the first metatarsal head moderate deviation hallux and elevated digit.  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  She does have structural deformity I think she would do well long-term with good orthotic treatment but we will get a start up to try to treat the joint that is inflamed and I did a proximal nerve block aspirated the second MPJ getting out a small amount of clear fluid injected quarter cc dexamethasone Kenalog and applied thick pad to reduce pressure on the joint surface.  Reappoint to recheck  X-rays indicate that there is no signs of fracture structural alignment looks good with structural bunion hammertoe deformity noted right over left

## 2020-05-13 ENCOUNTER — Ambulatory Visit: Payer: No Typology Code available for payment source | Admitting: Podiatry

## 2020-05-13 ENCOUNTER — Encounter: Payer: Self-pay | Admitting: Podiatry

## 2020-05-13 ENCOUNTER — Other Ambulatory Visit: Payer: Self-pay

## 2020-05-13 DIAGNOSIS — M2041 Other hammer toe(s) (acquired), right foot: Secondary | ICD-10-CM | POA: Diagnosis not present

## 2020-05-13 DIAGNOSIS — M779 Enthesopathy, unspecified: Secondary | ICD-10-CM | POA: Diagnosis not present

## 2020-05-13 DIAGNOSIS — M21619 Bunion of unspecified foot: Secondary | ICD-10-CM

## 2020-05-13 NOTE — Progress Notes (Signed)
Subjective:   Patient ID: Hayley Miranda, female   DOB: 40 y.o.   MRN: [id number]   HPI Patient states that she is doing some better but still feeling pressure against the metatarsal with long-term history of bunion deformity and relative flatfoot deformity   ROS      Objective:  Physical Exam  Neurovascular status intact with patient found to have continued inflammation around the second MPJ right improved but still moderately tender when pressed.  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  I educated her on orthotics and reduction of pressure and ultimately still may require surgery but we will continue to work with this conservatively

## 2020-05-14 ENCOUNTER — Emergency Department (HOSPITAL_COMMUNITY)
Admission: EM | Admit: 2020-05-14 | Discharge: 2020-05-14 | Disposition: A | Discharge status: Home / Self Care | Payer: No Typology Code available for payment source | Attending: Emergency Medicine | Admitting: Emergency Medicine

## 2020-05-14 ENCOUNTER — Other Ambulatory Visit: Payer: Self-pay

## 2020-05-14 ENCOUNTER — Encounter (HOSPITAL_COMMUNITY): Payer: Self-pay

## 2020-05-14 DIAGNOSIS — Z87891 Personal history of nicotine dependence: Secondary | ICD-10-CM | POA: Diagnosis not present

## 2020-05-14 DIAGNOSIS — S59912A Unspecified injury of left forearm, initial encounter: Secondary | ICD-10-CM | POA: Diagnosis present

## 2020-05-14 DIAGNOSIS — S51832A Puncture wound without foreign body of left forearm, initial encounter: Secondary | ICD-10-CM | POA: Insufficient documentation

## 2020-05-14 DIAGNOSIS — W5501XA Bitten by cat, initial encounter: Secondary | ICD-10-CM | POA: Insufficient documentation

## 2020-05-14 DIAGNOSIS — Z23 Encounter for immunization: Secondary | ICD-10-CM | POA: Insufficient documentation

## 2020-05-14 MED ORDER — TETANUS-DIPHTH-ACELL PERTUSSIS 5-2.5-18.5 LF-MCG/0.5 IM SUSY
0.5000 mL | PREFILLED_SYRINGE | Freq: Once | INTRAMUSCULAR | Status: AC
  Administered 2020-05-14: 0.5 mL via INTRAMUSCULAR
  Filled 2020-05-14: qty 0.5

## 2020-05-14 MED ORDER — DOXYCYCLINE HYCLATE 100 MG PO CAPS: 100.0000 mg | ORAL_CAPSULE | Freq: Two times a day (BID) | ORAL | 0 refills | Status: AC

## 2020-05-14 NOTE — Discharge Instructions (Signed)
You came to the emergency department today to be evaluated for your cat bite.  Due to your bite you were started on doxycycline antibiotics.  Please take 1 pill twice a day for the next 7 days.  You received a tetanus booster while in the emergency department today.  You decided to defer rabies prophylactic treatment at this time.  Please contact your vet determine the need for 10-day quarantine for your pet.  If you decide that you need rabies vaccination please return to the emergency department or urgent care with rabies vaccination capabilities.  You may have diarrhea from the antibiotics.  It is very important that you continue to take the antibiotics even if you get diarrhea unless a medical professional tells you that you may stop taking them.  If you stop too early the bacteria you are being treated for will become stronger and you may need different, more powerful antibiotics that have more side effects and worsening diarrhea.  Please stay well hydrated and consider probiotics as they may decrease the severity of your diarrhea.  Please be aware that if you take any hormonal contraception (birth control pills, nexplanon, the ring, etc) that your birth control will not work while you are taking antibiotics and you need to use back up protection as directed on the birth control medication information insert.   Get help right away if: You have a red streak that leads away from your wound. You have non-clear fluid or more blood coming from your wound. There is pus or a bad smell coming from your wound. You have trouble moving your injured area. You have numbness or tingling that extends beyond the wound.

## 2020-05-14 NOTE — ED Provider Notes (Signed)
[facility]-EMERGENCY DEPT Provider Note   CSN: [id number] Arrival date & time: 05/14/20  1701     History Chief Complaint  Patient presents with  . Animal Bite    Hayley Miranda is a 40 y.o. female presents to the emergency department with a chief complaint of cat bite.  Patient was bit by her cat 2 days prior.  Patient's cat has never received rabies vaccination.  Patient was bitten to left forearm.  Endorses erythema around wounds.  Erythema has increased over the last 2 days.  She complains of tenderness to palpation.  Patient denies any pruritus or purulent discharge.  Patient has no fevers, chills, numbness extremities, weakness to extremities, decreased range of motion to wrist or elbow.  Patient is right-hand dominant.  HPI     Past Medical History:  Diagnosis Date  . Abnormal Pap smear of cervix   . Blood transfusion   . Blood transfusion without reported diagnosis   . CELLULITIS, FOOT, LEFT 09/16/2008  . Dermatophytosis of nail 02/01/2007       . STD (sexually transmitted disease) 06/2009   trichamonus    Patient Active Problem List   Diagnosis Date Noted  . Onychomycosis 03/12/2015  . Former smoker 03/03/2008  . Allergic rhinitis 03/03/2008  . Attention deficit disorder (ADD) in adult 02/01/2007    Past Surgical History:  Procedure Laterality Date  . COLPOSCOPY    . FOOT SURGERY Right 2010  . INTRAUTERINE DEVICE (IUD) INSERTION  03/2016   Mirena      OB History    Gravida  1   Para  1   Term  1   Preterm      AB      Living  1     SAB      IAB      Ectopic      Multiple      Live Births  1           Family History  Problem Relation Age of Onset  . Hyperlipidemia Father        fhx  . Hypertension Father        fhx  . Cancer Other        prostate/fhx  . Osteoporosis Mother   . Lung cancer Maternal Grandmother   . Lung cancer Paternal Grandmother   . Lung cancer Paternal Grandfather   . Liver cancer  Maternal Grandfather     Social History   Tobacco Use  . Smoking status: Former Smoker    Packs/day: 0.25    Types: Cigarettes    Quit date: 09/01/2018    Years since quitting: 1.7  . Smokeless tobacco: Never Used  Vaping Use  . Vaping Use: Never used  Substance Use Topics  . Alcohol use: Yes    Alcohol/week: 6.0 standard drinks    Types: 6 Cans of beer per week  . Drug use: No    Home Medications Prior to Admission medications   Medication Sig Start Date End Date Taking? Authorizing Provider  amphetamine-dextroamphetamine (ADDERALL) 20 MG tablet Take 1 tablet (20 mg total) by mouth 3 (three) times daily. May fill today. 04/15/20   Shelva Majestic, MD  fexofenadine-pseudoephedrine (ALLEGRA-D 24) 180-240 MG 24 hr tablet Take 1 tablet by mouth as needed.    [provider]  levonorgestrel (MIRENA) 20 MCG/24HR IUD 1 each by Intrauterine route once.    [provider]    Allergies  Sulfamethoxazole-trimethoprim  Review of Systems   Review of Systems  Constitutional: Negative for chills and fever.  Musculoskeletal: Negative for arthralgias, joint swelling, myalgias and neck stiffness.  Skin: Positive for color change and wound. Negative for pallor and rash.  Neurological: Negative for weakness and numbness.    Physical Exam Updated Vital Signs BP 135/82   Pulse 88   Temp 98.2 F (36.8 C)   Resp 18   Ht 5\' 4"  (1.626 m)   Wt 47.6 kg   SpO2 100%   BMI 18.02 kg/m   Physical Exam Vitals and nursing note reviewed.  Constitutional:      General: She is not in acute distress.    Appearance: She is not ill-appearing, toxic-appearing or diaphoretic.  HENT:     Head: Normocephalic.  Eyes:     General: No scleral icterus.       Right eye: No discharge.        Left eye: No discharge.  Cardiovascular:     Rate and Rhythm: Normal rate.  Pulmonary:     Effort: Pulmonary effort is normal. No respiratory distress.     Breath sounds: No stridor.   Musculoskeletal:     Left elbow: No swelling, deformity, effusion or lacerations. Normal range of motion. No tenderness.     Left forearm: Laceration and tenderness present. No swelling, edema, deformity or bony tenderness.     Left wrist: No swelling, deformity, effusion, lacerations, tenderness, bony tenderness or snuff box tenderness. Normal range of motion.     Left hand: No swelling, deformity, lacerations, tenderness or bony tenderness. Normal range of motion. Normal sensation. Normal capillary refill.     Comments: Patient has 2 pairs of puncture wounds to left forearm, wounds are located to radial and ulnar aspects of left forearm.  Surrounding erythema and elliptical shape preserved.  Patient has minimal tenderness to palpation.  No purulent discharge or bleeding observed.  Skin:    General: Skin is warm and dry.  Neurological:     General: No focal deficit present.     Mental Status: She is alert.  Psychiatric:        Behavior: Behavior is cooperative.     ED Results / Procedures / Treatments   Labs (all labs ordered are listed, but only abnormal results are displayed) Labs Reviewed - No data to display  EKG None  Radiology No results found.  Procedures Procedures   Medications Ordered in ED Medications  Tdap (BOOSTRIX) injection 0.5 mL (has no administration in time range)    ED Course  I have reviewed the triage vital signs and the nursing notes.  Pertinent labs & imaging results that were available during my care of the patient were reviewed by me and considered in my medical decision making (see chart for details).    MDM Rules/Calculators/A&P                          40 year old female no acute distress, nontoxic-appearing.  Patient presents with chief complaint of cat bite to left forearm.  Bite occurred 2 days prior.  Patient was bit by her own cat.  Patient reports that has not had any rabies vaccination, however has never been outside, in contact with  no other animal, or demonstrated any abnormal behavior.  Patient endorses erythema around wound site that has grown in size over the last 2 days.  Patient denies any systemic symptoms.  Had shared decision-making with patient  about rabies prophylaxis.  Patient will defer treatment at this time.  Will speak to her vet tomorrow about 10-day quarantine for her animal as well as risk for rabies.  Patient given information to follow-up with emergency department if she requires rabies prophylaxis.  Patient will be given Tdap and 7-day course of doxycycline.  Patient given strict return precautions.  Patient expressed understanding of all instructions and is agreeable with this plan.    Final Clinical Impression(s) / ED Diagnoses Final diagnoses:  Cat bite, initial encounter    Rx / DC Orders ED Discharge Orders         Ordered    doxycycline (VIBRAMYCIN) 100 MG capsule  2 times daily        05/14/20 1802           Berneice Heinrich 05/14/20 1809    Gerhard Munch, MD 05/14/20 1843

## 2020-05-14 NOTE — ED Notes (Signed)
PA-C at the bedside to evaluate.  

## 2020-05-14 NOTE — ED Triage Notes (Signed)
Cat bite 2 days ago to left forearm. Redness noted. Denies heat and swelling.

## 2020-05-15 ENCOUNTER — Telehealth: Payer: Self-pay

## 2020-05-15 ENCOUNTER — Ambulatory Visit: Payer: No Typology Code available for payment source | Admitting: Family Medicine

## 2020-05-15 NOTE — Telephone Encounter (Signed)
Please advise 

## 2020-05-15 NOTE — Telephone Encounter (Signed)
Patient was seen in ED yesterday.   Nurse Assessment Nurse: Ferol Luz RN, Aurther Loft Date/Time (Eastern Time): 05/14/2020 4:36:47 PM Confirm and document reason for call. If symptomatic, describe symptoms. ---Caller states she was bit by her cat. Bite on Lft forearm. Occurred night before last. Bite puncture x4. Unsure if shots are current or up to date. Redness, swelling, tenderness at bite site, no active drainage. Does the patient have any new or worsening symptoms? ---Yes Will a triage be completed? ---Yes Related visit to physician within the last 2 weeks? ---No Does the PT have any chronic conditions? (i.e. diabetes, asthma, this includes High risk factors for pregnancy, etc.) ---No Is the patient pregnant or possibly pregnant? (Ask all females between the ages of 87-55) ---No Is this a behavioral health or substance abuse call? ---No Guidelines Guideline Title Affirmed Question Affirmed Notes Nurse Date/Time (Eastern Time) Animal Bite [1] Any break in skin from BITE (e.g., cut, puncture or scratch) AND [2] PET animal (e.g., dog, cat, or ferret) at risk for Bogard, RNAurther Loft 05/14/2020 4:42:29 PM PLEASE NOTE: All timestamps contained within this report are represented as [place] Standard Time. CONFIDENTIALTY NOTICE: This fax transmission is intended only for the addressee. It contains information that is legally privileged, confidential or otherwise protected from use or disclosure. If you are not the intended recipient, you are strictly prohibited from reviewing, disclosing, copying using or disseminating any of this information or taking any action in reliance on or regarding this information. If you have received this fax in error, please notify us immediately by telephone so that we can arrange for its return to [place]. Phone: [phone], Toll-Free: [phone], Fax: [phone] Page: 2 of 2 Call Id: [id number] Guidelines Guideline Title Affirmed Question Affirmed Notes Nurse  Date/Time Lamount Cohen Time) RABIES (e.g., sick, stray, unprovoked bite, developing country) Disp. Time Lamount Cohen Time) Disposition Final User 05/14/2020 4:45:19 PM Go to ED Now Yes Bogard, RN, Tenna Delaine Disagree/Comply Disagree Caller Understands Yes PreDisposition Did not know what to do Care Advice Given Per Guideline GO TO ED NOW: * You need to be seen in the Emergency Department. * Go to the ED at ___________ Hospital. * Leave now. Drive carefully. CLEAN THE WOUND: * Wash all bites and scratches right away with soap and water for 5 minutes. * Then rinse the area with water for 2 to 3 minutes. * This will help decrease the chance of infection. COVER THE WOUND: * Cover the wound with a dressing. * Use a sterile gauze, an adhesive bandage (such as a Band-Aid), or a clean cloth. CARE ADVICE given per Animal Bite (Adult) guideline. Referrals GO TO FACILITY OTHER - SPEC[facility]

## 2020-05-15 NOTE — Telephone Encounter (Signed)
We would you like for me to advise on?  Any specific questions?  It appears she was treated in the emergency room

## 2020-05-15 NOTE — Telephone Encounter (Signed)
No questions Just wanted you to know that she was seen in the ED

## 2020-06-03 ENCOUNTER — Other Ambulatory Visit: Payer: No Typology Code available for payment source

## 2020-07-13 ENCOUNTER — Other Ambulatory Visit: Payer: Self-pay

## 2020-07-13 NOTE — Telephone Encounter (Signed)
Rx sent to Dr. Hunter for approval. 

## 2020-07-13 NOTE — Telephone Encounter (Signed)
  LAST APPOINTMENT DATE: 04/15/20  NEXT APPOINTMENT DATE:@10 /14/2022  MEDICATION:amphetamine-dextroamphetamine (ADDERALL) 20 MG tablet  PHARMACY: [id number] - [place], [place] - [address] AT [facility] OF [address]

## 2020-07-13 NOTE — Telephone Encounter (Signed)
Yes thanks you may load 3 refill request

## 2020-07-14 MED ORDER — AMPHETAMINE-DEXTROAMPHETAMINE 20 MG PO TABS: 20.0000 mg | ORAL_TABLET | Freq: Three times a day (TID) | ORAL | 0 refills | Status: DC

## 2020-07-14 NOTE — Telephone Encounter (Signed)
Request loaded.

## 2020-08-13 ENCOUNTER — Other Ambulatory Visit: Payer: Self-pay

## 2020-08-13 NOTE — Telephone Encounter (Signed)
  Encourage patient to contact the pharmacy for refills or they can request refills through [facility]  LAST APPOINTMENT DATE:  04/15/20   NEXT APPOINTMENT DATE: 10/16/2020  MEDICATION: amphetamine-dextroamphetamine (ADDERALL) 20 MG tablet  PHARMACY: [id number] - [place], [place] - [address] AT [facility] OF [address]  Let patient know to contact pharmacy at the end of the day to make sure medication is ready.  Please notify patient to allow 48-72 hours to process

## 2020-08-14 MED ORDER — AMPHETAMINE-DEXTROAMPHETAMINE 20 MG PO TABS: 20.0000 mg | ORAL_TABLET | Freq: Three times a day (TID) | ORAL | 0 refills | Status: DC

## 2020-08-14 NOTE — Telephone Encounter (Signed)
Rx has been sent to Dr. Hunter for approval.  

## 2020-08-17 ENCOUNTER — Telehealth: Payer: Self-pay

## 2020-08-17 MED ORDER — AMPHETAMINE-DEXTROAMPHETAMINE 20 MG PO TABS: 20.0000 mg | ORAL_TABLET | Freq: Three times a day (TID) | ORAL | 0 refills | Status: DC

## 2020-08-17 NOTE — Telephone Encounter (Signed)
Script has been resent to Dr. Durene Cal for different pharmacy.

## 2020-08-17 NOTE — Telephone Encounter (Signed)
Nurse Assessment Nurse: Naoma Diener, RN, Marchelle Folks Date/Time (Eastern Time): 08/15/2020 10:29:22 PM Please select the assessment type ---Request for controlled medication refill Additional Documentation ---Caller states her medication is on backorder and she found a pharmacy that has her medication. [facility] [address] [facility] said [facility] not [facility] for her. No new or worsening s/s per caller. Is there an on-call physician for the client? ---Yes Do the client directives specifically allow for paging the on-call regarding scheduled drugs? ---No Additional Documentation ---Caller verbalized understanding. Disp. Time Lamount Cohen Time) Disposition Final User 08/15/2020 10:30:14 PM Clinical Call Yes Naoma Diener, RN, Marchelle Folks

## 2020-08-17 NOTE — Telephone Encounter (Signed)
[facility] to make [facility] aw[facility] that pt will pick up prescription and will pay out of pocket, however I was on hold for over 10 min and the recording kept saying "there are more than 4 callers ahead of me", so I hung up and will try again in the morning.

## 2020-08-17 NOTE — Telephone Encounter (Signed)
Patient calling stating that all [facility] are on back order for Adderall 20 mg and would like it sent to    [id number] [place], [place] - [address] AT [facility] OF [address] Phone:  [phone]  Fax:  [phone]

## 2020-08-18 NOTE — Telephone Encounter (Signed)
Called to follow up with pt and she was able to get her Rx, I was still never able to get through to the pharmacy.

## 2020-10-07 NOTE — Progress Notes (Signed)
Phone [phone] In person visit   Subjective:   Hayley Miranda is a 40 y.o. year old very pleasant female patient who presents for/with See problem oriented charting Chief Complaint  Patient presents with   Adult Deficit Disorder (ADD)     This visit occurred during the SARS-CoV-2 public health emergency.  Safety protocols were in place, including screening questions prior to the visit, additional usage of staff PPE, and extensive cleaning of exam room while observing appropriate contact time as indicated for disinfecting solutions.   Past Medical History-  Patient Active Problem List   Diagnosis Date Noted   Former smoker 03/03/2008    Priority: 1.   Attention deficit disorder (ADD) in adult 02/01/2007    Priority: 2.   Onychomycosis 03/12/2015    Priority: 3.   Allergic rhinitis 03/03/2008    Priority: 3.    Medications- reviewed and updated Current Outpatient Medications  Medication Sig Dispense Refill   fexofenadine-pseudoephedrine (ALLEGRA-D 24) 180-240 MG 24 hr tablet Take 1 tablet by mouth as needed.     levonorgestrel (MIRENA) 20 MCG/24HR IUD 1 each by Intrauterine route once.     triamcinolone cream (KENALOG) 0.1 % Apply 1 application topically 2 (two) times daily. For 7-10 days maximum 80 g 0   amphetamine-dextroamphetamine (ADDERALL) 20 MG tablet Take 1 tablet (20 mg total) by mouth 3 (three) times daily. May fill today. 90 tablet 0   amphetamine-dextroamphetamine (ADDERALL) 20 MG tablet Take 1 tablet (20 mg total) by mouth 3 (three) times daily. 90 tablet 0   amphetamine-dextroamphetamine (ADDERALL) 20 MG tablet Take 1 tablet (20 mg total) by mouth 3 (three) times daily. 90 tablet 0   No current facility-administered medications for this visit.     Objective:  BP 126/64   Pulse 86   Temp 98.5 F (36.9 C) (Temporal)   Ht 5\' 4"  (1.626 m)   Wt 111 lb 3.2 oz (50.4 kg)   SpO2 98%   BMI 19.09 kg/m  Gen: NAD, resting comfortably CV: RRR no murmurs rubs or  gallops Lungs: CTAB no crackles, wheeze, rhonchi Ext: no edema Skin: warm, dry    Assessment and Plan   # ED visit for animal bite S:Pt was seen in ED on 05/14/2020 for a c/o a cat bite. Patient es bitten by her cat 2 days before visit. She had never received rabies vaccination. She was bitten on her left forearm. She also endorsed erythema around wounds and it increased over the lat two days.  -She also reported tenderness to palpitations. She denied any pruritus or purulent discharge.  -Received her TDAP injection and Doxycycline 100 mg twice daily. A/P: no ongoing issues- received appropriate treatment.  - no concern for rabies from patient- indoors only   #Rash on hand S:only on left hand. She is allergic to wool- has been cutting some carpet- wonders if fibers hitting hand. Cortisone helps. Itches in her sleep.  ROS-not ill appearing, no fever/chills. No new medications. Not immunocompromised. No mucus membrane involvement.  A/P: I wonder if this could be related to an irritant at work such as the fibers that are coming off the carpet-we discussed trying triamcinolone twice daily for 7 to 10 days and covering with Vaseline-can use a clear glove at night.  If fails to improve over the next 7 to 10 days or recurs we could always do a dermatology referral  # ADD S:control: reasonably controlled medication: Adderall 20 mg x3 daily  Controlled substance contract: 09/10/14 and  team was to update last visit NCCSRS/PDMP reviewed:low risk habits - reviewed today Original diagnosis:6th grade-and had been treated by Dr. Lovell Sheehan through 2015  Birth Control: IUD  Since the last visit has the patient had any: Appetite changes? No Unintentional weight loss? no Is medication working well ? Yes Does patient take drug holidays? No Difficulties falling to sleep or maintaining sleep? No Any anxiety?  No Any cardiac issues (fainting or paliptations)?No Suicidal thoughts? No Changes in health since  last visit? No other than rash on hand New medications? No Any illicit substance abuse? No Has the patient taken his medication today? No A/P: ADD well controlled. BP controlled. No significant side effects- continue current meds and refill for 3 months  -UDS on 04/15/20- knows to space alcohol and meds by at least 8 hours  # Right 2nd toe pain-patient with a bunion on his foot and has some pain at the MTP joint of the second toe-I think she had capsulitis-occurred after playing soccer for the first time.  We are going to refer to podiatry for their opinion-she is doing much better after drainage and injection  Recommended follow up:  No future appointments.  Lab/Order associations:   ICD-10-CM   1. Attention deficit disorder (ADD) in adult  F98.8     2. Screening for hyperlipidemia  Z13.220     3. Pain in right toe(s)  M79.674     4. Need for immunization against influenza  Z23 Flu Vaccine QUAD 78mo+IM (Fluarix, Fluzone & Alfiuria Quad PF)      Meds ordered this encounter  Medications   triamcinolone cream (KENALOG) 0.1 %    Sig: Apply 1 application topically 2 (two) times daily. For 7-10 days maximum    Dispense:  80 g    Refill:  0   amphetamine-dextroamphetamine (ADDERALL) 20 MG tablet    Sig: Take 1 tablet (20 mg total) by mouth 3 (three) times daily. May fill today.    Dispense:  90 tablet    Refill:  0    Send prior authorization form if needed   amphetamine-dextroamphetamine (ADDERALL) 20 MG tablet    Sig: Take 1 tablet (20 mg total) by mouth 3 (three) times daily.    Dispense:  90 tablet    Refill:  0    May fill in one month.   amphetamine-dextroamphetamine (ADDERALL) 20 MG tablet    Sig: Take 1 tablet (20 mg total) by mouth 3 (three) times daily.    Dispense:  90 tablet    Refill:  0    May fill in 2 months.   I,Jada Bradford,acting as a scribe for Tana Conch, MD.,have documented all relevant documentation on the behalf of Tana Conch, MD,as directed by   Tana Conch, MD while in the presence of Tana Conch, MD.  I, Tana Conch, MD, have reviewed all documentation for this visit. The documentation on 10/16/20 for the exam, diagnosis, procedures, and orders are all accurate and complete.  Return precautions advised.  Tana Conch, MD

## 2020-10-16 ENCOUNTER — Ambulatory Visit: Payer: No Typology Code available for payment source | Admitting: Family Medicine

## 2020-10-16 ENCOUNTER — Other Ambulatory Visit: Payer: Self-pay

## 2020-10-16 ENCOUNTER — Encounter: Payer: Self-pay | Admitting: Family Medicine

## 2020-10-16 VITALS — BP 126/64 | HR 86 | Temp 98.5°F | Ht 64.0 in | Wt 111.2 lb

## 2020-10-16 DIAGNOSIS — F988 Other specified behavioral and emotional disorders with onset usually occurring in childhood and adolescence: Secondary | ICD-10-CM | POA: Diagnosis not present

## 2020-10-16 DIAGNOSIS — M79674 Pain in right toe(s): Secondary | ICD-10-CM

## 2020-10-16 DIAGNOSIS — Z23 Encounter for immunization: Secondary | ICD-10-CM | POA: Diagnosis not present

## 2020-10-16 DIAGNOSIS — Z13220 Encounter for screening for lipoid disorders: Secondary | ICD-10-CM

## 2020-10-16 MED ORDER — AMPHETAMINE-DEXTROAMPHETAMINE 20 MG PO TABS: 20.0000 mg | ORAL_TABLET | Freq: Three times a day (TID) | ORAL | 0 refills | Status: DC

## 2020-10-16 MED ORDER — TRIAMCINOLONE ACETONIDE 0.1 % EX CREA: 1.0000 "application " | TOPICAL_CREAM | Freq: Two times a day (BID) | CUTANEOUS | 0 refills | Status: DC

## 2020-10-16 NOTE — Patient Instructions (Addendum)
Health Maintenance Due  Topic Date Due   INFLUENZA VACCINE - Regular dose shot today in office.  08/03/2020    Try Triamcinolone cream twice a day for 7-10 days. When applying, cover in Vaseline and use a clear glove at night.   Recommended follow up: Return in about 6 months (around 04/16/2021) for physical or sooner if needed.

## 2021-01-19 ENCOUNTER — Other Ambulatory Visit: Payer: Self-pay

## 2021-01-19 MED ORDER — AMPHETAMINE-DEXTROAMPHETAMINE 20 MG PO TABS: 20.0000 mg | ORAL_TABLET | Freq: Three times a day (TID) | ORAL | 0 refills | Status: DC

## 2021-01-19 NOTE — Telephone Encounter (Signed)
MEDICATION:  amphetamine-dextroamphetamine (ADDERALL) 20 MG tablet  PHARMACY:  [id number] - [place], [place] - [address] AT [facility] OF [address] Phone:  [phone]  Fax:  [phone]      Comments:   **Let patient know to contact pharmacy at the end of the day to make sure medication is ready. **  ** Please notify patient to allow 48-72 hours to process**  **Encourage patient to contact the pharmacy for refills or they can request refills through [facility]**

## 2021-01-19 NOTE — Telephone Encounter (Signed)
Rx sent to Dr. Hunter for approval. 

## 2021-01-28 ENCOUNTER — Telehealth (INDEPENDENT_AMBULATORY_CARE_PROVIDER_SITE_OTHER): Payer: BC Managed Care – PPO | Admitting: Physician Assistant

## 2021-01-28 DIAGNOSIS — J0110 Acute frontal sinusitis, unspecified: Secondary | ICD-10-CM | POA: Diagnosis not present

## 2021-01-28 MED ORDER — PREDNISONE 20 MG PO TABS: 20.0000 mg | ORAL_TABLET | Freq: Two times a day (BID) | ORAL | 0 refills | Status: AC

## 2021-01-28 MED ORDER — AMOXICILLIN-POT CLAVULANATE 875-125 MG PO TABS: 1.0000 | ORAL_TABLET | Freq: Two times a day (BID) | ORAL | 0 refills | Status: AC

## 2021-01-28 NOTE — Progress Notes (Signed)
° °  Virtual Visit via Video Note  I connected with  Hayley Miranda  on 01/28/21 at  9:30 AM EST by a video enabled telemedicine application and verified that I am speaking with the correct person using two identifiers.  Location: Patient: home Provider: Nature conservation officer at [employer] Persons present: Patient and myself   I discussed the limitations of evaluation and management by telemedicine and the availability of in person appointments. The patient expressed understanding and agreed to proceed.   History of Present Illness:  Chief complaint: ?Sinusitis  Symptom onset: Two weeks ago, started after return trip from [place] Pertinent positives: Sinus pressure, headache, R ear pain Pertinent negatives: Fever, chills, body aches, N/V/D, cough Treatments tried: Allegra-D for a few days and then regular Allegra Vaccine status: First COVID-19 vaccine only Sick exposure: No known sick contacts    Observations/Objective:   Gen: Awake, alert, no acute distress, some nasal congestion Resp: Breathing is even and non-labored Psych: calm/pleasant demeanor Neuro: Alert and Oriented x 3, + facial symmetry, speech is clear.   Assessment and Plan:  1. Acute frontal sinusitis, recurrence not specified Persistent symptoms >10 days despite conservative efforts at home. Will Rx Augmentin at this time, take with food. Cautioned on antibiotic use and possible side effects. Rx prednisone burst for added relief. Advised nasal saline, humidifier, and pushing fluids. Call if worse or no improvement.     Follow Up Instructions:    I discussed the assessment and treatment plan with the patient. The patient was provided an opportunity to ask questions and all were answered. The patient agreed with the plan and demonstrated an understanding of the instructions.   The patient was advised to call back or seek an in-person evaluation if the symptoms worsen or if the condition fails to improve  as anticipated.  Sayan Aldava M Calixto Pavel, PA-C

## 2021-02-22 ENCOUNTER — Other Ambulatory Visit: Payer: Self-pay | Admitting: Family Medicine

## 2021-02-22 MED ORDER — AMPHETAMINE-DEXTROAMPHETAMINE 20 MG PO TABS: 20.0000 mg | ORAL_TABLET | Freq: Three times a day (TID) | ORAL | 0 refills | Status: DC

## 2021-02-22 NOTE — Telephone Encounter (Signed)
Pt states current pharmacy is still out of her medication. She has found one that does have it in stock.  MEDICATION:amphetamine-dextroamphetamine (ADDERALL) 20 MG tablet  PHARMACY: [place], [address] [place], [place] [address] Phone: [phone]

## 2021-02-22 NOTE — Telephone Encounter (Signed)
Message sent to Dr. Hunter  

## 2021-02-22 NOTE — Telephone Encounter (Signed)
Patient has called back in regard.  Is concerned b/c the pharmacy only has 200 pills left.

## 2021-02-22 NOTE — Telephone Encounter (Signed)
Can this be filled by you in Dr. Ronney Lion absence?

## 2021-03-04 ENCOUNTER — Other Ambulatory Visit: Payer: Self-pay

## 2021-03-04 ENCOUNTER — Ambulatory Visit: Payer: BC Managed Care – PPO

## 2021-03-04 DIAGNOSIS — M779 Enthesopathy, unspecified: Secondary | ICD-10-CM

## 2021-03-04 DIAGNOSIS — M2041 Other hammer toe(s) (acquired), right foot: Secondary | ICD-10-CM

## 2021-03-04 DIAGNOSIS — M21619 Bunion of unspecified foot: Secondary | ICD-10-CM

## 2021-03-04 NOTE — Progress Notes (Signed)
SITUATION: ?Reason for Visit: Fitting and Delivery of Custom Fabricated Foot Orthoses ?Patient Report: Patient reports comfort and is satisfied with device. ? ?OBJECTIVE DATA: ?Patient History / Diagnosis:   ?  ICD-10-CM   ?1. Hammer toe of right foot  M20.41   ?  ?2. Capsulitis  M77.9   ?  ?3. Bunion  M21.619   ?  ? ? ?Provided Device:  Custom Functional Foot Orthotics ?    [employer] [id number] ? ?GOAL OF ORTHOSIS ?- Improve gait ?- Decrease energy expenditure ?- Improve Balance ?- Provide Triplanar stability of foot complex ?- Facilitate motion ? ?ACTIONS PERFORMED ?Patient was fit with foot orthotics trimmed to shoe last. Patient tolerated fittign procedure.  ? ?Patient was provided with verbal and written instruction and demonstration regarding donning, doffing, wear, care, proper fit, function, purpose, cleaning, and use of the orthosis and in all related precautions and risks and benefits regarding the orthosis. ? ?Patient was also provided with verbal instruction regarding how to report any failures or malfunctions of the orthosis and necessary follow up care. Patient was also instructed to contact our office regarding any change in status that may affect the function of the orthosis. ? ?Patient demonstrated independence with proper donning, doffing, and fit and verbalized understanding of all instructions. ? ?PLAN: ?Patient is to follow up in one week or as necessary (PRN). All questions were answered and concerns addressed. Plan of care was discussed with and agreed upon by the patient. ? ?

## 2021-03-15 NOTE — Progress Notes (Incomplete)
Phone [phone]   Subjective:  Patient presents today for their annual physical. Chief complaint-noted.   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   Patient Active Problem List   Diagnosis Date Noted   Onychomycosis 03/12/2015   Former smoker 03/03/2008   Allergic rhinitis 03/03/2008   Attention deficit disorder (ADD) in adult 02/01/2007   Past Surgical History:  Procedure Laterality Date   COLPOSCOPY     FOOT SURGERY Right 2010   INTRAUTERINE DEVICE (IUD) INSERTION  03/2016   Mirena     Family History  Problem Relation Age of Onset   Hyperlipidemia Father        fhx   Hypertension Father        fhx   Cancer Other        prostate/fhx   Osteoporosis Mother    Lung cancer Maternal Grandmother    Lung cancer Paternal Grandmother    Lung cancer Paternal Grandfather    Liver cancer Maternal Grandfather     Medications- reviewed and updated Current Outpatient Medications  Medication Sig Dispense Refill   amphetamine-dextroamphetamine (ADDERALL) 20 MG tablet Take 1 tablet (20 mg total) by mouth 3 (three) times daily. (Patient not taking: Reported on 01/28/2021) 90 tablet 0   amphetamine-dextroamphetamine (ADDERALL) 20 MG tablet Take 1 tablet (20 mg total) by mouth 3 (three) times daily. May fill today. 90 tablet 0   fexofenadine-pseudoephedrine (ALLEGRA-D 24) 180-240 MG 24 hr tablet Take 1 tablet by mouth as needed.     levonorgestrel (MIRENA) 20 MCG/24HR IUD 1 each by Intrauterine route once.     triamcinolone cream (KENALOG) 0.1 % Apply 1 application topically 2 (two)  times daily. For 7-10 days maximum 80 g 0   No current facility-administered medications for this visit.    Allergies-reviewed and updated Allergies  Allergen Reactions   Sulfamethoxazole-Trimethoprim     REACTION: rash--fever    Social History   Social History Narrative   Divorced-currently dating.  Has 1 daughter from prior marriage   Objective  Objective:  There were no vitals taken for this visit. Gen: NAD, resting comfortably HEENT: Mucous membranes are moist. Oropharynx normal Neck: no thyromegaly CV: RRR no murmurs rubs or gallops Lungs: CTAB no crackles, wheeze, rhonchi Abdomen: soft/nontender/nondistended/normal bowel sounds. No rebound or guarding.  Ext: no edema Skin: warm, dry Neuro: grossly normal, moves all extremities, PERRLA***   Assessment and Plan   41 y.o. female presenting for annual physical.  Health Maintenance counseling: 1. Anticipatory guidance: Patient counseled regarding regular dental exams ***q6 months, eye exams -no vision issues in past, some changes to vision at night- recommended updated eye exam***,  avoiding smoking and second hand smoke*** , limiting alcohol to 1 beverage per day*** .  No illicit drugs -*** 2. Risk factor reduction:  Advised patient of need for regular exercise and diet rich and fruits and vegetables to reduce risk of heart attack and stroke.  Exercise-  5 days a week and indoor soccer and kickball***.  Diet-reasonably healthy diet- doing good jo maintaining weight- do not want her  to lose more. ***.  Wt Readings from Last 3 Encounters:  10/16/20 111 lb 3.2 oz (50.4 kg)  05/14/20 105 lb (47.6 kg)  04/15/20 114 lb 3.2 oz (51.8 kg)   3. Immunizations/screenings/ancillary studies Immunization History  Administered Date(s) Administered   Influenza Whole 11/02/2009   Influenza,inj,Quad PF,6+ Mos 10/08/2012, 09/18/2013, 09/10/2014, 10/09/2015, 10/25/2016, 11/21/2017, 11/23/2018, 10/16/2020   PFIZER(Purple Top)SARS-COV-2  Vaccination 03/28/2020   Td 02/01/2007   Tdap 06/10/2015, 05/14/2020   Health Maintenance Due  Topic Date Due   PAP SMEAR-Modifier  03/28/2021   4. Cervical cancer screening- pap smear 03/28/16 with 5 year repeat planned *** 5. Breast cancer screening-  breast exam with GYN *** and mammogram -she is going to get this updated *** 6. Colon cancer screening -- no family history, start at age 25 *** 37. Skin cancer screening- ***advised regular sunscreen use. Denies worrisome, changing, or new skin lesions.  8. Birth control/STD check-  Mirena. Long term relationship so declines STD testing. *** 9. Osteoporosis screening at 85-  not at age yet- would screen postmenopause. Does protein shake with calcium and vitamin D as well*** -former smoker - - will get UA- not of age for screening for lung cancer screening ***  Status of chronic or acute concerns   # ADD S:control: *** medication: ***Adderall 20 mg 3 times daily Controlled substance contract: ***09/10/2014, updated 04/15/20 UDS: ***Plan December 23, 2019 NCCSRS/PDMP reviewed: *** Original diagnosis: ***6 th grade-and had been treated by Dr. Lovell Sheehan through 2015 Since the last visit has the patient had any: Birth control: IUD***   Appetite changes? {Yes/No:30480221} Unintentional weight loss?{Yes/No:30480221} Is medication working well ? {Yes/No:30480221} Does patient take drug holidays? {Yes/No:30480221} Difficulties falling to sleep or maintaining sleep? {Yes/No:30480221} Any anxiety? {Yes/No:30480221} Any cardiac issues (fainting or paliptations)?{Yes/No:30480221} Suicidal thoughts? {Yes/No:30480221} Changes in health since last visit? {Yes/No:30480221} New medications? {Yes/No:30480221} Any illicit substance abuse? {Yes/No:30480221} Has the patient taken his medication today? {Yes/No:30480221} A/P: ***   Recommended follow up: No follow-ups on file. Future Appointments  Date Time Provider Department Center  04/29/2021  8:40 AM  Shelva Majestic, MD [facility]    No chief complaint on file.  Lab/Order associations:*** fasting No diagnosis found.  No orders of the defined types were placed in this encounter.   Return precautions advised.  Scheryl Marten

## 2021-04-20 DIAGNOSIS — S61218A Laceration without foreign body of other finger without damage to nail, initial encounter: Secondary | ICD-10-CM | POA: Diagnosis not present

## 2021-04-23 ENCOUNTER — Other Ambulatory Visit: Payer: Self-pay | Admitting: Family Medicine

## 2021-04-23 MED ORDER — AMPHETAMINE-DEXTROAMPHETAMINE 20 MG PO TABS: 20.0000 mg | ORAL_TABLET | Freq: Three times a day (TID) | ORAL | 0 refills | Status: DC

## 2021-04-23 NOTE — Telephone Encounter (Signed)
Refill request approval sent to Dr. Durene Cal.  ?

## 2021-04-23 NOTE — Telephone Encounter (Signed)
.. ?  Encourage patient to contact the pharmacy for refills or they can request refills through [facility] ? ?LAST APPOINTMENT DATE:  10/16/20 ? ?NEXT APPOINTMENT DATE: 04/29/21 ? ?MEDICATION:amphetamine-dextroamphetamine (ADDERALL) 20 MG tablet ? ?Is the patient out of medication?  ? ?PHARMACY: ?[facility] [phone] - [place], [place] - [address] AT [facility] OF [address] Phone:  [phone]  ?Fax:  [phone]  ?  ? ? ?Let patient know to contact pharmacy at the end of the day to make sure medication is ready. ? ?Please notify patient to allow 48-72 hours to process  ?

## 2021-04-29 ENCOUNTER — Encounter: Payer: Self-pay | Admitting: Family Medicine

## 2021-04-29 ENCOUNTER — Ambulatory Visit (INDEPENDENT_AMBULATORY_CARE_PROVIDER_SITE_OTHER): Payer: BC Managed Care – PPO | Admitting: Family Medicine

## 2021-04-29 VITALS — BP 110/62 | HR 79 | Temp 98.3°F | Ht 64.0 in | Wt 110.2 lb

## 2021-04-29 DIAGNOSIS — Z87891 Personal history of nicotine dependence: Secondary | ICD-10-CM | POA: Diagnosis not present

## 2021-04-29 DIAGNOSIS — Z0000 Encounter for general adult medical examination without abnormal findings: Secondary | ICD-10-CM

## 2021-04-29 DIAGNOSIS — Z79899 Other long term (current) drug therapy: Secondary | ICD-10-CM | POA: Diagnosis not present

## 2021-04-29 DIAGNOSIS — F988 Other specified behavioral and emotional disorders with onset usually occurring in childhood and adolescence: Secondary | ICD-10-CM | POA: Diagnosis not present

## 2021-04-29 LAB — POC URINALSYSI DIPSTICK (AUTOMATED)
Bilirubin, UA: NEGATIVE
Blood, UA: NEGATIVE
Glucose, UA: NEGATIVE
Ketones, UA: NEGATIVE
Leukocytes, UA: NEGATIVE
Nitrite, UA: NEGATIVE
Protein, UA: NEGATIVE
Spec Grav, UA: 1.01 (ref 1.010–1.025)
Urobilinogen, UA: 0.2 E.U./dL
pH, UA: 7 (ref 5.0–8.0)

## 2021-04-29 MED ORDER — AMPHETAMINE-DEXTROAMPHETAMINE 20 MG PO TABS: 20.0000 mg | ORAL_TABLET | Freq: Three times a day (TID) | ORAL | 0 refills | Status: DC

## 2021-04-29 NOTE — Patient Instructions (Addendum)
Please stop by lab before you go ?If you have mychart- we will send your results within 3 business days of [place] receiving them.  ?If you do not have mychart- we will call you about results within 5 business days of [place] receiving them.  ?*please also note that you will see labs on mychart as soon as they post. I will later go in and write notes on them- will say "notes from Dr. Yong Channel"  ? ?Please see GYN and have them send [place] pap smear and mammogram ? ?Recommended follow up: Return in about 6 months (around 10/29/2021) for followup or sooner if needed.Schedule b4 you leave. ?

## 2021-05-02 LAB — DRUG MONITORING, PANEL 8 WITH CONFIRMATION, URINE
6 Acetylmorphine: NEGATIVE ng/mL (ref <10)
Alcohol Metabolites: POSITIVE ng/mL — AB (ref <500)
Amphetamine: 1893 ng/mL — ABNORMAL HIGH (ref <250)
Amphetamines: POSITIVE ng/mL — AB (ref <500)
Benzodiazepines: NEGATIVE ng/mL (ref <100)
Buprenorphine, Urine: NEGATIVE ng/mL (ref <5)
Cocaine Metabolite: NEGATIVE ng/mL (ref <150)
Creatinine: 87.7 mg/dL (ref >=20.0)
Ethyl Glucuronide (ETG): >10000 ng/mL — ABNORMAL HIGH (ref <500)
Ethyl Sulfate (ETS): 5577 ng/mL — ABNORMAL HIGH (ref <100)
MDMA: NEGATIVE ng/mL (ref <500)
Marijuana Metabolite: NEGATIVE ng/mL (ref <20)
Methamphetamine: NEGATIVE ng/mL (ref <250)
Opiates: NEGATIVE ng/mL (ref <100)
Oxidant: NEGATIVE ug/mL (ref <200)
Oxycodone: NEGATIVE ng/mL (ref <100)
pH: 6.9 (ref 4.5–9.0)

## 2021-05-02 LAB — DM TEMPLATE

## 2021-07-22 ENCOUNTER — Telehealth: Payer: Self-pay | Admitting: Family Medicine

## 2021-07-22 NOTE — Telephone Encounter (Signed)
..   Encourage patient to contact the pharmacy for refills or they can request refills through [facility]  LAST APPOINTMENT DATE:  04/29/21  NEXT APPOINTMENT DATE: 11/03/21  MEDICATION: amphetamine-dextroamphetamine (ADDERALL) 20 MG tablet   Is the patient out of medication? Yes  PHARMACY: [facility] [address] [place] [address]  Patient states this is the only place that has her adderall in stock.

## 2021-07-23 ENCOUNTER — Other Ambulatory Visit: Payer: Self-pay

## 2021-07-23 MED ORDER — AMPHETAMINE-DEXTROAMPHETAMINE 20 MG PO TABS: 20.0000 mg | ORAL_TABLET | Freq: Three times a day (TID) | ORAL | 0 refills | Status: DC

## 2021-07-23 NOTE — Telephone Encounter (Signed)
Last refill: 06/29/21 #90, 0 Last OV: 04/29/21 dx. CPE

## 2021-07-23 NOTE — Telephone Encounter (Signed)
[facility] [address]

## 2021-09-17 ENCOUNTER — Other Ambulatory Visit: Payer: Self-pay | Admitting: Family Medicine

## 2021-09-17 NOTE — Telephone Encounter (Signed)
  LAST APPOINTMENT DATE:   04/29/21 OV with PCP   NEXT APPOINTMENT DATE: 11/03/21 OV with PCP   MEDICATION: amphetamine-dextroamphetamine (ADDERALL) 20 MG tablet [[id number]]    Is the patient out of medication?  Almost: will run out 09/20/21   PHARMACY: [id number] [place], [place] - [address] AT [facility] OF [address]  [address], [place] [address]  Phone:  [phone]  Fax:  [phone]  DEA #:  [id number]

## 2021-09-20 MED ORDER — AMPHETAMINE-DEXTROAMPHETAMINE 20 MG PO TABS: 20.0000 mg | ORAL_TABLET | Freq: Three times a day (TID) | ORAL | 0 refills | Status: DC

## 2021-09-20 NOTE — Telephone Encounter (Signed)
Sent to Dr. Hunter for approval.  °

## 2021-09-21 ENCOUNTER — Other Ambulatory Visit: Payer: Self-pay | Admitting: Family Medicine

## 2021-09-21 MED ORDER — AMPHETAMINE-DEXTROAMPHETAMINE 20 MG PO TABS: 20.0000 mg | ORAL_TABLET | Freq: Three times a day (TID) | ORAL | 0 refills | Status: DC

## 2021-09-21 NOTE — Telephone Encounter (Signed)
..   Encourage patient to contact the pharmacy for refills or they can request refills through [place]:  Please schedule appointment if longer than 1 year  NEXT APPOINTMENT DATE: 11/1 / 23   MEDICATION:amphetamine-dextroamphetamine (ADDERALL) 20 MG tablet  Is the patient out of medication?  Yes   PHARMACY:[facility] [address]   Let patient know to contact pharmacy at the end of the day to make sure medication is ready.  Please notify patient to allow 48-72 hours to process

## 2021-09-27 ENCOUNTER — Encounter: Payer: Self-pay | Admitting: *Deleted

## 2021-10-21 ENCOUNTER — Other Ambulatory Visit: Payer: Self-pay | Admitting: Family Medicine

## 2021-10-21 MED ORDER — AMPHETAMINE-DEXTROAMPHETAMINE 20 MG PO TABS: 20.0000 mg | ORAL_TABLET | Freq: Three times a day (TID) | ORAL | 0 refills | Status: DC

## 2021-10-21 NOTE — Telephone Encounter (Signed)
  LAST APPOINTMENT DATE:  Please schedule appointment if longer than 1 year  04/29/21  NEXT APPOINTMENT DATE:  11/03/21  MEDICATION:  amphetamine-dextroamphetamine (ADDERALL) 20 MG tablet   Is the patient out of medication? Yes  PHARMACY: [facility] [place], [place] Phone: [phone]  Fax: [phone]      Let patient know to contact pharmacy at the end of the day to make sure medication is ready.  Please notify patient to allow 48-72 hours to process

## 2021-11-03 ENCOUNTER — Ambulatory Visit: Payer: BC Managed Care – PPO | Admitting: Family Medicine

## 2021-11-03 ENCOUNTER — Encounter: Payer: Self-pay | Admitting: Family Medicine

## 2021-11-03 VITALS — BP 122/80 | HR 76 | Temp 98.3°F | Ht 64.0 in | Wt 111.8 lb

## 2021-11-03 DIAGNOSIS — F988 Other specified behavioral and emotional disorders with onset usually occurring in childhood and adolescence: Secondary | ICD-10-CM | POA: Diagnosis not present

## 2021-11-03 DIAGNOSIS — Z23 Encounter for immunization: Secondary | ICD-10-CM

## 2021-11-03 DIAGNOSIS — R1900 Intra-abdominal and pelvic swelling, mass and lump, unspecified site: Secondary | ICD-10-CM

## 2021-11-03 MED ORDER — TRIAMCINOLONE ACETONIDE 0.1 % EX CREA: 1.0000 | TOPICAL_CREAM | Freq: Two times a day (BID) | CUTANEOUS | 0 refills | Status: DC

## 2021-11-03 NOTE — Progress Notes (Signed)
Phone [phone] In person visit   Subjective:   Hayley Miranda is a 41 y.o. year old very pleasant female patient who presents for/with See problem oriented charting Chief Complaint  Patient presents with   Follow-up   ADD   Past Medical History-  Patient Active Problem List   Diagnosis Date Noted   Former smoker 03/03/2008    Priority: High   Attention deficit disorder (ADD) in adult 02/01/2007    Priority: Medium    Onychomycosis 03/12/2015    Priority: Low   Allergic rhinitis 03/03/2008    Priority: Low    Medications- reviewed and updated Current Outpatient Medications  Medication Sig Dispense Refill   amphetamine-dextroamphetamine (ADDERALL) 20 MG tablet Take 1 tablet (20 mg total) by mouth 3 (three) times daily. May fill in 2 months 90 tablet 0   amphetamine-dextroamphetamine (ADDERALL) 20 MG tablet Take 1 tablet (20 mg total) by mouth 3 (three) times daily. May fill today 90 tablet 0   amphetamine-dextroamphetamine (ADDERALL) 20 MG tablet Take 1 tablet (20 mg total) by mouth 3 (three) times daily. May fill in 1 month 90 tablet 0   fexofenadine-pseudoephedrine (ALLEGRA-D 24) 180-240 MG 24 hr tablet Take 1 tablet by mouth as needed.     levonorgestrel (MIRENA) 20 MCG/24HR IUD 1 each by Intrauterine route once.     triamcinolone cream (KENALOG) 0.1 % Apply 1 Application topically 2 (two) times daily. For 7-10 days maximum 80 g 0   No current facility-administered medications for this visit.     Objective:  BP 122/80   Pulse 76   Temp 98.3 F (36.8 C)   Ht 5\' 4"  (1.626 m)   Wt 111 lb 12.8 oz (50.7 kg)   SpO2 100%   BMI 19.19 kg/m  Gen: NAD, resting comfortably CV: RRR no murmurs rubs or gallops Lungs: CTAB no crackles, wheeze, rhonchi Ext: no edema Skin: warm, dry Midline abdominal bulge above umbilicus 2 x 2 cm and smaller one above that about 1x 1 cm- lipoma vs small hernias - unable to reduce     Assessment and Plan   # ADD S:control:  reasonable  control medication: Adderall 20 mg 3 times daily Controlled substance contract: 09/10/2014, updated 04/15/20 UDS: 04/29/21- alcohol noted - states occasional 3-6 beers like Friday or Saturday- encouraged to limit to 1 per day NCCSRS/PDMP reviewed: low risk trend Original diagnosis: 6 th grade-and had been treated by Dr. Thursday through 2015 Since the last visit has the patient had any: Birth control: IUD   Appetite changes? No Unintentional weight loss?No Is medication working well ? Yes Does patient take drug holidays? No Difficulties falling to sleep or maintaining sleep? No Any anxiety?  No Any cardiac issues (fainting or paliptations)?No Suicidal thoughts? No Changes in health since last visit? No New medications? No Any illicit substance abuse? No Has the patient taken his medication today? No A/P: ADD well controlled- just had refills but can refill for up to 6 months from today and need physical at that time   # abdominal wall bulge S:noted slight bulge with pushups about 2 years ago in abdomen- has noted another area below the initial one as well.  A/P: possible midline hernia vs lipoma- will get ultrasound and consider surgical consult. No nausea vomiting. Not able to reduce area but no substantial pain   Recommended follow up: Return in about 6 months (around 05/04/2022) for physical or sooner if needed.Schedule b4 you leave.  Lab/Order associations:   ICD-10-CM  1. Attention deficit disorder (ADD) in adult  F98.8     2. Abdominal wall bulge  R19.00 US Abdomen Limited    3. Need for immunization against influenza  Z23 Flu Vaccine QUAD 16mo+IM (Fluarix, Fluzone & Alfiuria Quad PF)      Meds ordered this encounter  Medications   triamcinolone cream (KENALOG) 0.1 %    Sig: Apply 1 Application topically 2 (two) times daily. For 7-10 days maximum    Dispense:  80 g    Refill:  0    Return precautions advised.  Garret Reddish, MD

## 2021-11-03 NOTE — Patient Instructions (Addendum)
Call and schedule PAP smear with OBGYN.  We will call you within two weeks about your referral to ultrasound through [facility].  Their phone number is [phone].  Please call them if you have not heard in 1-2 weeks  Recommended follow up: Return in about 6 months (around 05/04/2022) for physical or sooner if needed.Schedule b4 you leave.

## 2022-01-21 ENCOUNTER — Other Ambulatory Visit: Payer: Self-pay | Admitting: Family Medicine

## 2022-01-21 NOTE — Telephone Encounter (Signed)
  LAST APPOINTMENT DATE:  11/03/21  NEXT APPOINTMENT DATE: 05/06/22  MEDICATION:  amphetamine-dextroamphetamine (ADDERALL) 20 MG tablet   Is the patient out of medication? Yes  PHARMACY:  [facility] [place], [place] Phone: [phone]  Fax: [phone]     Let patient know to contact pharmacy at the end of the day to make sure medication is ready.  Please notify patient to allow 48-72 hours to process

## 2022-01-24 MED ORDER — AMPHETAMINE-DEXTROAMPHETAMINE 20 MG PO TABS: 20.0000 mg | ORAL_TABLET | Freq: Three times a day (TID) | ORAL | 0 refills | Status: DC

## 2022-02-23 ENCOUNTER — Ambulatory Visit (INDEPENDENT_AMBULATORY_CARE_PROVIDER_SITE_OTHER): Payer: BC Managed Care – PPO | Admitting: Obstetrics and Gynecology

## 2022-02-23 ENCOUNTER — Encounter: Payer: Self-pay | Admitting: Obstetrics and Gynecology

## 2022-02-23 ENCOUNTER — Other Ambulatory Visit (HOSPITAL_COMMUNITY)
Admission: RE | Admit: 2022-02-23 | Discharge: 2022-02-23 | Disposition: A | Discharge status: Home / Self Care | Payer: BC Managed Care – PPO | Source: Ambulatory Visit | Attending: Obstetrics and Gynecology | Admitting: Obstetrics and Gynecology

## 2022-02-23 VITALS — BP 128/68 | HR 77 | Ht 63.5 in | Wt 109.0 lb

## 2022-02-23 DIAGNOSIS — Z30431 Encounter for routine checking of intrauterine contraceptive device: Secondary | ICD-10-CM

## 2022-02-23 DIAGNOSIS — Z23 Encounter for immunization: Secondary | ICD-10-CM | POA: Diagnosis not present

## 2022-02-23 DIAGNOSIS — Z01419 Encounter for gynecological examination (general) (routine) without abnormal findings: Secondary | ICD-10-CM | POA: Diagnosis not present

## 2022-02-23 DIAGNOSIS — Z124 Encounter for screening for malignant neoplasm of cervix: Secondary | ICD-10-CM

## 2022-02-23 DIAGNOSIS — Z7185 Encounter for immunization safety counseling: Secondary | ICD-10-CM

## 2022-02-23 NOTE — Patient Instructions (Signed)

## 2022-02-23 NOTE — Progress Notes (Signed)
42 y.o. G56P1001 Single White or Caucasian Not Hispanic or Latino female here for annual exam. She has a mirena IUD, placed in 3/18. Just occasional spotting. Same partner, no dyspareunia.   No bowel or bladder c/o.    No LMP recorded. (Menstrual status: IUD).          Sexually active: Yes.    The current method of family planning is IUD.   Inserted 03/29/16   Exercising: Yes.     HIIT Smoker:  no  Health Maintenance: Pap:  03-28-16 WNL NEG HR HPV; 03-26-15 WNL NEG HR HPV  History of abnormal Pap:   Yes 01-19-11 ASCUS, HX CIN I Colpo BX 2010  MMG:   04-01-15 WNL  BMD:   none  Colonoscopy: none  TDaP:  05/14/20 Gardasil: none    reports that she quit smoking about 3 years ago. Her smoking use included cigarettes. She smoked an average of .25 packs per day. She has never used smokeless tobacco. She reports current alcohol use of about 6.0 standard drinks of alcohol per week. She reports that she does not use drugs. Working in a [employer]. Daughter 51.   Past Medical History:  Diagnosis Date   Abnormal Pap smear of cervix    Blood transfusion    Blood transfusion without reported diagnosis    CELLULITIS, FOOT, LEFT 09/16/2008   Dermatophytosis of nail 02/01/2007        STD (sexually transmitted disease) 06/2009   trichamonus    Past Surgical History:  Procedure Laterality Date   COLPOSCOPY     FOOT SURGERY Right 2010   INTRAUTERINE DEVICE (IUD) INSERTION  03/2016   Mirena     Current Outpatient Medications  Medication Sig Dispense Refill   amphetamine-dextroamphetamine (ADDERALL) 20 MG tablet Take 1 tablet (20 mg total) by mouth 3 (three) times daily. May fill in 2 months 90 tablet 0   fexofenadine-pseudoephedrine (ALLEGRA-D 24) 180-240 MG 24 hr tablet Take 1 tablet by mouth as needed.     levonorgestrel (MIRENA) 20 MCG/24HR IUD 1 each by Intrauterine route once.     triamcinolone cream (KENALOG) 0.1 % Apply 1 Application topically 2 (two) times daily. For 7-10 days maximum 80 g 0    No current facility-administered medications for this visit.    Family History  Problem Relation Age of Onset   Osteoporosis Mother    Hyperlipidemia Father        fhx   Hypertension Father        fhx   Lung cancer Maternal Grandmother    Liver cancer Maternal Grandfather    Lung cancer Paternal Grandmother    Lung cancer Paternal Grandfather    Cancer Other        prostate/fhx   Breast cancer Neg Hx    Ovarian cancer Neg Hx     Review of Systems  All other systems reviewed and are negative.   Exam:   BP 128/68   Pulse 77   Ht 5' 3.5" (1.613 m)   Wt 109 lb (49.4 kg)   SpO2 100%   BMI 19.01 kg/m   Weight change: @WEIGHTCHANGE$ @ Height:   Height: 5' 3.5" (161.3 cm)  Ht Readings from Last 3 Encounters:  02/23/22 5' 3.5" (1.613 m)  11/03/21 5' 4"$  (1.626 m)  04/29/21 5' 4"$  (1.626 m)    General appearance: alert, cooperative and appears stated age Head: Normocephalic, without obvious abnormality, atraumatic Neck: no adenopathy, supple, symmetrical, trachea midline and thyroid normal to inspection and  palpation Lungs: clear to auscultation bilaterally Cardiovascular: regular rate and rhythm Breasts: normal appearance, no masses or tenderness Abdomen: soft, non-tender; non distended,  no masses,  no organomegaly Extremities: extremities normal, atraumatic, no cyanosis or edema Skin: Skin color, texture, turgor normal. No rashes or lesions Lymph nodes: Cervical, supraclavicular, and axillary nodes normal. No abnormal inguinal nodes palpated Neurologic: Grossly normal   Pelvic: External genitalia:  no lesions              Urethra:  normal appearing urethra with no masses, tenderness or lesions              Bartholins and Skenes: normal                 Vagina: normal appearing vagina with normal color and discharge, no lesions              Cervix: no lesions               Bimanual Exam:  Uterus:  normal size, contour, position, consistency, mobility, non-tender               Adnexa: no mass, fullness, tenderness               Rectovaginal: Confirms               Anus:  normal sphincter tone, no lesions  Gae Dry, CMA chaperoned for the exam.  1. Well woman exam Discussed breast self exam Discussed calcium and vit D intake Mammogram overdue, # given to schedule  2. Screening for cervical cancer - Cytology - PAP  3. IUD check up Doing well  4. Immunization counseling Discussed gardasil series, she would like to start it. - HPV 9-valent vaccine,Recombinat

## 2022-02-25 LAB — CYTOLOGY - PAP
Comment: NEGATIVE
Diagnosis: NEGATIVE
High risk HPV: NEGATIVE

## 2022-03-21 ENCOUNTER — Other Ambulatory Visit: Payer: Self-pay | Admitting: Family Medicine

## 2022-03-22 MED ORDER — AMPHETAMINE-DEXTROAMPHETAMINE 20 MG PO TABS: 20.0000 mg | ORAL_TABLET | Freq: Three times a day (TID) | ORAL | 0 refills | Status: DC

## 2022-03-22 NOTE — Telephone Encounter (Signed)
Last refill: 01/24/22 #90, 0 Last OV: 11/03/21 dx. ADD

## 2022-04-27 ENCOUNTER — Ambulatory Visit: Payer: Self-pay

## 2022-05-06 ENCOUNTER — Ambulatory Visit (INDEPENDENT_AMBULATORY_CARE_PROVIDER_SITE_OTHER): Payer: BC Managed Care – PPO | Admitting: Family Medicine

## 2022-05-06 ENCOUNTER — Encounter: Payer: Self-pay | Admitting: Family Medicine

## 2022-05-06 VITALS — BP 130/70 | HR 80 | Temp 98.5°F | Ht 63.5 in | Wt 110.6 lb

## 2022-05-06 DIAGNOSIS — J449 Chronic obstructive pulmonary disease, unspecified: Secondary | ICD-10-CM | POA: Insufficient documentation

## 2022-05-06 DIAGNOSIS — Z0000 Encounter for general adult medical examination without abnormal findings: Secondary | ICD-10-CM

## 2022-05-06 DIAGNOSIS — F988 Other specified behavioral and emotional disorders with onset usually occurring in childhood and adolescence: Secondary | ICD-10-CM

## 2022-05-06 DIAGNOSIS — Z79899 Other long term (current) drug therapy: Secondary | ICD-10-CM

## 2022-05-06 DIAGNOSIS — Z87891 Personal history of nicotine dependence: Secondary | ICD-10-CM

## 2022-05-06 DIAGNOSIS — Z13220 Encounter for screening for lipoid disorders: Secondary | ICD-10-CM

## 2022-05-06 MED ORDER — AMPHETAMINE-DEXTROAMPHETAMINE 20 MG PO TABS: 20.0000 mg | ORAL_TABLET | Freq: Three times a day (TID) | ORAL | 0 refills | Status: DC

## 2022-05-06 NOTE — Patient Instructions (Addendum)
Schedule your mammogram with breast center  Please go to [facility] central [facility] (updated 02/28/2019) - located [address] across [address] from [place] - in the basement - Hours: 7:30-5:30 PM M-F. You do NOT need an appointment.    Recommended follow up: Return in about 6 months (around 11/06/2022) for followup or sooner if needed.Schedule b4 you leave.

## 2022-05-06 NOTE — Progress Notes (Signed)
Phone [phone]   Subjective:  Patient presents today for their annual physical. Chief complaint-noted.   See problem oriented charting- ROS- full  review of systems was completed and negative except for: seasonal allergies  The following were reviewed and entered/updated in epic: Past Medical History:  Diagnosis Date   Abnormal Pap smear of cervix    Blood transfusion    Blood transfusion without reported diagnosis    CELLULITIS, FOOT, LEFT 09/16/2008   Dermatophytosis of nail 02/01/2007        STD (sexually transmitted disease) 06/2009   trichamonus   Patient Active Problem List   Diagnosis Date Noted   Former smoker 03/03/2008    Priority: High   Attention deficit disorder (ADD) in adult 02/01/2007    Priority: Medium    Onychomycosis 03/12/2015    Priority: Low   Allergic rhinitis 03/03/2008    Priority: Low   Past Surgical History:  Procedure Laterality Date   COLPOSCOPY     FOOT SURGERY Right 2010   INTRAUTERINE DEVICE (IUD) INSERTION  03/2016   Mirena     Family History  Problem Relation Age of Onset   Osteoporosis Mother    Hyperlipidemia Father        fhx   Hypertension Father        fhx   Lung cancer Maternal Grandmother    Liver cancer Maternal Grandfather    Lung cancer Paternal Grandmother    Lung cancer Paternal Grandfather    Cancer Other        prostate/fhx   Breast cancer Neg Hx    Ovarian cancer Neg Hx     Medications- reviewed and updated Current Outpatient Medications  Medication Sig Dispense Refill   fexofenadine-pseudoephedrine (ALLEGRA-D 24) 180-240 MG 24 hr tablet Take 1 tablet by mouth as needed.     levonorgestrel (MIRENA) 20 MCG/24HR IUD 1 each by Intrauterine route once.     triamcinolone cream (KENALOG) 0.1 % Apply 1 Application topically 2 (two) times daily. For 7-10 days maximum 80 g 0   amphetamine-dextroamphetamine (ADDERALL) 20 MG tablet Take 1 tablet (20 mg total) by mouth 3 (three) times daily. May fill in 2 months  90 tablet 0   amphetamine-dextroamphetamine (ADDERALL) 20 MG tablet Take 1 tablet (20 mg total) by mouth 3 (three) times daily. May fill today 90 tablet 0   amphetamine-dextroamphetamine (ADDERALL) 20 MG tablet Take 1 tablet (20 mg total) by mouth 3 (three) times daily. May fill in 1 month 90 tablet 0   No current facility-administered medications for this visit.    Allergies-reviewed and updated Allergies  Allergen Reactions   Sulfamethoxazole-Trimethoprim     REACTION: rash--fever    Social History   Social History Narrative   Divorced-currently dating.  Has 1 daughter from prior marriage   Objective  Objective:  BP 130/70   Pulse 80   Temp 98.5 F (36.9 C)   Ht 5' 3.5" (1.613 m)   Wt 110 lb 9.6 oz (50.2 kg)   SpO2 98%   BMI 19.28 kg/m  Gen: NAD, resting comfortably HEENT: Mucous membranes are moist. Oropharynx normal Neck: no thyromegaly CV: RRR no murmurs rubs or gallops Lungs: CTAB no crackles, wheeze, rhonchi Abdomen: soft/nontender/nondistended/normal bowel sounds. No rebound or guarding.  Ext: no edema Skin: warm, dry Neuro: grossly normal, moves all extremities, PERRLA   Assessment and Plan   42 y.o. female presenting for annual physical.  Health Maintenance counseling: 1. Anticipatory guidance: Patient counseled regarding regular dental exams -  q6 months, eye exams - no vision issues,  avoiding smoking and second hand smoke , limiting alcohol to 1 beverage per day , no illicit drugs .   2. Risk factor reduction:  Advised patient of need for regular exercise and diet rich and fruits and vegetables to reduce risk of heart attack and stroke.  Exercise- still excellent m-f working with trainer still Diet/weight management-weight stable- encouraged avoid weight loss.  Wt Readings from Last 3 Encounters:  05/06/22 110 lb 9.6 oz (50.2 kg)  02/23/22 109 lb (49.4 kg)  11/03/21 111 lb 12.8 oz (50.7 kg)  3. Immunizations/screenings/ancillary studies- started gardasil  series with GYN, working through this.  Immunization History  Administered Date(s) Administered   HPV 9-valent 02/23/2022   Influenza Whole 11/02/2009   Influenza,inj,Quad PF,6+ Mos 10/08/2012, 09/18/2013, 09/10/2014, 10/09/2015, 10/25/2016, 11/21/2017, 11/23/2018, 10/16/2020, 11/03/2021   PFIZER(Purple Top)SARS-COV-2 Vaccination 03/28/2020   Td 02/01/2007   Tdap 06/10/2015, 05/14/2020  4. Cervical cancer screening- 02/23/22 with 5 year repeat 5. Breast cancer screening-  breast exam with GYN and mammogram - plans to schedule with breast center 6. Colon cancer screening - no family history, start at age 25  7. Skin cancer screening- Dr. Margo Aye if needed but no regular visits. advised regular sunscreen use. Denies worrisome, changing, or new skin lesions.  8. Birth control/STD check- mirena in place sine 2018- was told 2025 removal 9. Osteoporosis screening at 68- will plan on later in life 10. Smoking associated screening - former smoker quit in 2020- will get urinalysis. Not of age for lung cancer screening and didn't smoke long enocuhg  Status of chronic or acute concerns   # ADD S:control: reasonable control without side effects  medication: Adderall 20 mg 3 times daily Controlled substance contract: 09/10/2014, updated 04/15/20 UDS: 04/29/21 NCCSRS/PDMP reviewed: low risk trend Original diagnosis: 6 th grade-and had been treated by Dr. Lovell Sheehan through 2015 Birth control: IUD  A/P: stable- continue current medicines - 3 month supply sent in- can call in at that time   #screening hyperlipidemia S: Medication:none  Lab Results  Component Value Date   CHOL 153 03/29/2017   HDL 72 03/29/2017   LDLCALC 61 03/29/2017   TRIG 101 03/29/2017   CHOLHDL 2.1 03/29/2017   A/P: update with labs  #COPD- appears erroneously listed previously  Recommended follow up: Return in about 6 months (around 11/06/2022) for followup or sooner if needed.Schedule b4 you leave.  Lab/Order associations:NOT  fasting- will come fasting   ICD-10-CM   1. Preventative health care  Z00.00 Comprehensive metabolic panel    CBC with Differential/Platelet    Lipid panel    Urinalysis, Routine w reflex microscopic    DRUG MONITORING, PANEL 8 WITH CONFIRMATION, URINE    2. High risk medication use  Z79.899 Comprehensive metabolic panel    CBC with Differential/Platelet    DRUG MONITORING, PANEL 8 WITH CONFIRMATION, URINE    3. Former smoker  Z87.891 Urinalysis, Routine w reflex microscopic    4. Screening for hyperlipidemia  Z13.220 Lipid panel    5. Attention deficit disorder (ADD) in adult  F98.8 Comprehensive metabolic panel    CBC with Differential/Platelet      Meds ordered this encounter  Medications   amphetamine-dextroamphetamine (ADDERALL) 20 MG tablet    Sig: Take 1 tablet (20 mg total) by mouth 3 (three) times daily. May fill in 2 months    Dispense:  90 tablet    Refill:  0   amphetamine-dextroamphetamine (ADDERALL) 20 MG  tablet    Sig: Take 1 tablet (20 mg total) by mouth 3 (three) times daily. May fill today    Dispense:  90 tablet    Refill:  0   amphetamine-dextroamphetamine (ADDERALL) 20 MG tablet    Sig: Take 1 tablet (20 mg total) by mouth 3 (three) times daily. May fill in 1 month    Dispense:  90 tablet    Refill:  0    Return precautions advised.  Tana Conch, MD

## 2022-05-12 ENCOUNTER — Ambulatory Visit (INDEPENDENT_AMBULATORY_CARE_PROVIDER_SITE_OTHER): Payer: BC Managed Care – PPO

## 2022-05-12 DIAGNOSIS — Z23 Encounter for immunization: Secondary | ICD-10-CM | POA: Diagnosis not present

## 2022-05-12 NOTE — Progress Notes (Signed)
2nd Gardasil vaccination given IM left deltoid.  Patient tolerated injection well.  Last AEX 02/23/22 Dr. Oscar La.  Next injection is due 4 months from today.

## 2022-08-25 ENCOUNTER — Other Ambulatory Visit: Payer: Self-pay | Admitting: Family Medicine

## 2022-08-25 NOTE — Telephone Encounter (Signed)
Pt requesting refill for Adderall 20 mg. Last OV 05/06/2022

## 2022-08-29 MED ORDER — AMPHETAMINE-DEXTROAMPHETAMINE 20 MG PO TABS: 20.0000 mg | ORAL_TABLET | Freq: Three times a day (TID) | ORAL | 0 refills | Status: DC

## 2022-09-05 DIAGNOSIS — R0981 Nasal congestion: Secondary | ICD-10-CM | POA: Diagnosis not present

## 2022-09-05 DIAGNOSIS — H9201 Otalgia, right ear: Secondary | ICD-10-CM | POA: Diagnosis not present

## 2022-09-05 DIAGNOSIS — J019 Acute sinusitis, unspecified: Secondary | ICD-10-CM | POA: Diagnosis not present

## 2022-09-15 ENCOUNTER — Ambulatory Visit (INDEPENDENT_AMBULATORY_CARE_PROVIDER_SITE_OTHER): Payer: BC Managed Care – PPO | Admitting: *Deleted

## 2022-09-15 DIAGNOSIS — Z23 Encounter for immunization: Secondary | ICD-10-CM

## 2022-11-08 ENCOUNTER — Encounter: Payer: Self-pay | Admitting: Family Medicine

## 2022-11-08 ENCOUNTER — Other Ambulatory Visit: Payer: Self-pay

## 2022-11-08 ENCOUNTER — Ambulatory Visit: Payer: BC Managed Care – PPO | Admitting: Family Medicine

## 2022-11-08 VITALS — BP 122/70 | HR 97 | Temp 98.3°F | Ht 63.5 in | Wt 108.8 lb

## 2022-11-08 DIAGNOSIS — Z0000 Encounter for general adult medical examination without abnormal findings: Secondary | ICD-10-CM

## 2022-11-08 DIAGNOSIS — Z13220 Encounter for screening for lipoid disorders: Secondary | ICD-10-CM

## 2022-11-08 DIAGNOSIS — Z79899 Other long term (current) drug therapy: Secondary | ICD-10-CM

## 2022-11-08 DIAGNOSIS — Z23 Encounter for immunization: Secondary | ICD-10-CM

## 2022-11-08 DIAGNOSIS — F988 Other specified behavioral and emotional disorders with onset usually occurring in childhood and adolescence: Secondary | ICD-10-CM

## 2022-11-08 DIAGNOSIS — Z87891 Personal history of nicotine dependence: Secondary | ICD-10-CM | POA: Diagnosis not present

## 2022-11-08 MED ORDER — AMPHETAMINE-DEXTROAMPHETAMINE 20 MG PO TABS: 20.0000 mg | ORAL_TABLET | Freq: Three times a day (TID) | ORAL | 0 refills | Status: DC

## 2022-11-08 NOTE — Patient Instructions (Addendum)
Get Mammogram scheduled-  [facility] [place] Schedule an appointment by calling [phone].  Flu shot today  Please stop by lab before you go If you have mychart- we will send your results within 3 business days of [place] receiving them.  If you do not have mychart- we will call you about results within 5 business days of [place] receiving them.  *please also note that you will see labs on mychart as soon as they post. I will later go in and write notes on them- will say "notes from Dr. Durene Cal"   Recommended follow up: Return in about 6 months (around 05/08/2023) for physical or sooner if needed.Schedule b4 you leave.

## 2022-11-08 NOTE — Addendum Note (Signed)
Addended by: Lorn Junes on: 11/08/2022 02:56 PM   Modules accepted: Orders

## 2022-11-08 NOTE — Progress Notes (Signed)
Phone [phone] In person visit   Subjective:   Hayley Miranda is a 42 y.o. year old very pleasant female patient who presents for/with See problem oriented charting Chief Complaint  Patient presents with   Medical Management of Chronic Issues   ADD   Past Medical History-  Patient Active Problem List   Diagnosis Date Noted   Former smoker 03/03/2008    Priority: High   Attention deficit disorder (ADD) in adult 02/01/2007    Priority: Medium    Onychomycosis 03/12/2015    Priority: Low   Allergic rhinitis 03/03/2008    Priority: Low    Medications- reviewed and updated Current Outpatient Medications  Medication Sig Dispense Refill   fexofenadine-pseudoephedrine (ALLEGRA-D 24) 180-240 MG 24 hr tablet Take 1 tablet by mouth as needed.     levonorgestrel (MIRENA) 20 MCG/24HR IUD 1 each by Intrauterine route once.     triamcinolone cream (KENALOG) 0.1 % Apply 1 Application topically 2 (two) times daily. For 7-10 days maximum 80 g 0   amphetamine-dextroamphetamine (ADDERALL) 20 MG tablet Take 1 tablet (20 mg total) by mouth 3 (three) times daily. May fill in 1 month 90 tablet 0   amphetamine-dextroamphetamine (ADDERALL) 20 MG tablet Take 1 tablet (20 mg total) by mouth 3 (three) times daily. May fill today 90 tablet 0   amphetamine-dextroamphetamine (ADDERALL) 20 MG tablet Take 1 tablet (20 mg total) by mouth 3 (three) times daily. May fill in 2 months 90 tablet 0   No current facility-administered medications for this visit.     Objective:  BP 122/70   Pulse 97   Temp 98.3 F (36.8 C)   Ht 5' 3.5" (1.613 m)   Wt 108 lb 12.8 oz (49.4 kg)   SpO2 100%   BMI 18.97 kg/m  Gen: NAD, resting comfortably CV: RRR no murmurs rubs or gallops Lungs: CTAB no crackles, wheeze, rhonchi Ext: no edema Skin: warm, dry    Assessment and Plan   # ADD S:control: reasonable medication: Adderall 20 mg 3 times daily Controlled substance contract: 09/10/2014, updated 04/15/20 UDS:  04/29/21 but ordered on 05/06/2022 and she needs to complete this today NCCSRS/PDMP reviewed: Low risk refill pattern Original diagnosis: 6 th grade-and had been treated by Dr. Lovell Sheehan through 2015 Birth control: IUD - reports good for 1.5 more yeras Side effects: Denies   A/P: stable- continue current medicines . Needs 3 months refills today and then can call or mychart in 3 months for another 3 months of refills then see [place] for physical in  6 months  #cut finger last week but up to date on today from 2022  Recommended follow up: Return in about 6 months (around 05/08/2023) for physical or sooner if needed.Schedule b4 you leave.  Lab/Order associations:   ICD-10-CM   1. Attention deficit disorder (ADD) in adult  F98.8     2. Need for influenza vaccination  Z23 Flu vaccine trivalent PF, 6mos and older(Flulaval,Afluria,Fluarix,Fluzone)      Meds ordered this encounter  Medications   amphetamine-dextroamphetamine (ADDERALL) 20 MG tablet    Sig: Take 1 tablet (20 mg total) by mouth 3 (three) times daily. May fill in 1 month    Dispense:  90 tablet    Refill:  0   amphetamine-dextroamphetamine (ADDERALL) 20 MG tablet    Sig: Take 1 tablet (20 mg total) by mouth 3 (three) times daily. May fill today    Dispense:  90 tablet    Refill:  0  amphetamine-dextroamphetamine (ADDERALL) 20 MG tablet    Sig: Take 1 tablet (20 mg total) by mouth 3 (three) times daily. May fill in 2 months    Dispense:  90 tablet    Refill:  0    Return precautions advised.  Tana Conch, MD

## 2022-11-09 LAB — COMPREHENSIVE METABOLIC PANEL
ALT: 17 U/L (ref 0–35)
AST: 22 U/L (ref 0–37)
Albumin: 4.5 g/dL (ref 3.5–5.2)
Alkaline Phosphatase: 74 U/L (ref 39–117)
BUN: 11 mg/dL (ref 6–23)
CO2: 28 meq/L (ref 19–32)
Calcium: 9.2 mg/dL (ref 8.4–10.5)
Chloride: 97 meq/L (ref 96–112)
Creatinine, Ser: 0.84 mg/dL (ref 0.40–1.20)
GFR: 84.65 mL/min (ref >60.00)
Glucose, Bld: 80 mg/dL (ref 70–99)
Potassium: 4.1 meq/L (ref 3.5–5.1)
Sodium: 132 meq/L — ABNORMAL LOW (ref 135–145)
Total Bilirubin: 0.4 mg/dL (ref 0.2–1.2)
Total Protein: 7.2 g/dL (ref 6.0–8.3)

## 2022-11-09 LAB — URINALYSIS, ROUTINE W REFLEX MICROSCOPIC
Bilirubin Urine: NEGATIVE
Hgb urine dipstick: NEGATIVE
Ketones, ur: NEGATIVE
Leukocytes,Ua: NEGATIVE
Nitrite: NEGATIVE
RBC / HPF: NONE SEEN (ref 0–?)
Specific Gravity, Urine: <=1.005 — AB (ref 1.000–1.030)
Total Protein, Urine: NEGATIVE
Urine Glucose: NEGATIVE
Urobilinogen, UA: 0.2 (ref 0.0–1.0)
WBC, UA: NONE SEEN (ref 0–?)
pH: 7 (ref 5.0–8.0)

## 2022-11-09 LAB — CBC WITH DIFFERENTIAL/PLATELET
Basophils Absolute: 0.1 10*3/uL (ref 0.0–0.1)
Basophils Relative: 1.4 % (ref 0.0–3.0)
Eosinophils Absolute: 0.1 10*3/uL (ref 0.0–0.7)
Eosinophils Relative: 2.8 % (ref 0.0–5.0)
HCT: 38.8 % (ref 36.0–46.0)
Hemoglobin: 13.1 g/dL (ref 12.0–15.0)
Lymphocytes Relative: 35.2 % (ref 12.0–46.0)
Lymphs Abs: 1.9 10*3/uL (ref 0.7–4.0)
MCHC: 33.7 g/dL (ref 30.0–36.0)
MCV: 92.4 fL (ref 78.0–100.0)
Monocytes Absolute: 0.5 10*3/uL (ref 0.1–1.0)
Monocytes Relative: 9.9 % (ref 3.0–12.0)
Neutro Abs: 2.7 10*3/uL (ref 1.4–7.7)
Neutrophils Relative %: 50.7 % (ref 43.0–77.0)
Platelets: 368 10*3/uL (ref 150.0–400.0)
RBC: 4.2 Mil/uL (ref 3.87–5.11)
RDW: 12.5 % (ref 11.5–15.5)
WBC: 5.4 10*3/uL (ref 4.0–10.5)

## 2022-11-09 LAB — LIPID PANEL
Cholesterol: 165 mg/dL (ref 0–200)
HDL: 68.7 mg/dL (ref >39.00)
LDL Cholesterol: 65 mg/dL (ref 0–99)
NonHDL: 95.82
Total CHOL/HDL Ratio: 2
Triglycerides: 154 mg/dL — ABNORMAL HIGH (ref 0.0–149.0)
VLDL: 30.8 mg/dL (ref 0.0–40.0)

## 2022-11-10 LAB — DRUG MONITORING, PANEL 8 WITH CONFIRMATION, URINE
6 Acetylmorphine: NEGATIVE ng/mL (ref <10)
Alcohol Metabolites: POSITIVE ng/mL — AB (ref <500)
Amphetamine: 1741 ng/mL — ABNORMAL HIGH (ref <250)
Amphetamines: POSITIVE ng/mL — AB (ref <500)
Benzodiazepines: NEGATIVE ng/mL (ref <100)
Buprenorphine, Urine: NEGATIVE ng/mL (ref <5)
Cocaine Metabolite: NEGATIVE ng/mL (ref <150)
Creatinine: 31.9 mg/dL (ref >=20.0)
Ethyl Glucuronide (ETG): 1882 ng/mL — ABNORMAL HIGH (ref <500)
Ethyl Sulfate (ETS): 822 ng/mL — ABNORMAL HIGH (ref <100)
MDMA: NEGATIVE ng/mL (ref <500)
Marijuana Metabolite: NEGATIVE ng/mL (ref <20)
Methamphetamine: NEGATIVE ng/mL (ref <250)
Opiates: NEGATIVE ng/mL (ref <100)
Oxidant: NEGATIVE ug/mL (ref <200)
Oxycodone: NEGATIVE ng/mL (ref <100)
pH: 7.1 (ref 4.5–9.0)

## 2022-11-10 LAB — DM TEMPLATE

## 2023-01-31 ENCOUNTER — Other Ambulatory Visit: Payer: Self-pay | Admitting: Family Medicine

## 2023-01-31 MED ORDER — AMPHETAMINE-DEXTROAMPHETAMINE 20 MG PO TABS: 20.0000 mg | ORAL_TABLET | Freq: Three times a day (TID) | ORAL | 0 refills | Status: DC

## 2023-01-31 NOTE — Telephone Encounter (Signed)
Copied from CRM [id number]. Topic: Clinical - Medication Refill >> Jan 31, 2023  2:24 PM Almira Coaster wrote: Most Recent Primary Care Visit:  Provider: Shelva Majestic  Department: [facility]  Visit Type: OFFICE VISIT  Date: 11/08/2022  Medication: amphetamine-dextroamphetamine (ADDERALL) 20 MG tablet  Has the patient contacted their pharmacy? Yes, pharmacy told patient to contact ordering provider. (Agent: If no, request that the patient contact the pharmacy for the refill. If patient does not wish to contact the pharmacy document the reason why and proceed with request.) (Agent: If yes, when and what did the pharmacy advise?)  Is this the correct pharmacy for this prescription? Yes If no, delete pharmacy and type the correct one.  This is the patient's preferred pharmacy:  [id number] - [place], [place] - [address] AT [facility] OF [address] [place] [address] Phone: [phone] Fax: [phone]     Has the prescription been filled recently? No  Is the patient out of the medication? No, patient has 3 left.  Has the patient been seen for an appointment in the last year OR does the patient have an upcoming appointment? Yes  Can we respond through MyChart? Yes  Agent: Please be advised that Rx refills may take up to 3 business days. We ask that you follow-up with your pharmacy.

## 2023-02-02 ENCOUNTER — Other Ambulatory Visit: Payer: Self-pay

## 2023-02-02 MED ORDER — AMPHETAMINE-DEXTROAMPHETAMINE 20 MG PO TABS: 20.0000 mg | ORAL_TABLET | Freq: Three times a day (TID) | ORAL | 0 refills | Status: DC

## 2023-02-02 NOTE — Telephone Encounter (Signed)
Copied from CRM [id number]. Topic: Clinical - Prescription Issue >> Feb 02, 2023 10:58 AM Hayley Miranda wrote: Reason for CRM: Patient states her original prescription for her amphetamine-dextroamphetamine (ADDERALL) 20 MG table was sent to [facility] on [address] but medication is on backorder at that pharmacy. Wants to know if prescription can be sent to the [facility]  at [address] which is on file for patient, they have medication in stock.  [name] [phone]

## 2023-05-12 ENCOUNTER — Encounter: Payer: Self-pay | Admitting: Family Medicine

## 2023-05-12 ENCOUNTER — Ambulatory Visit (INDEPENDENT_AMBULATORY_CARE_PROVIDER_SITE_OTHER): Payer: BC Managed Care – PPO | Admitting: Family Medicine

## 2023-05-12 VITALS — BP 128/78 | HR 90 | Temp 98.4°F | Resp 18 | Ht 63.0 in | Wt 105.4 lb

## 2023-05-12 DIAGNOSIS — Z0000 Encounter for general adult medical examination without abnormal findings: Secondary | ICD-10-CM

## 2023-05-12 MED ORDER — AMPHETAMINE-DEXTROAMPHETAMINE 20 MG PO TABS: 20.0000 mg | ORAL_TABLET | Freq: Three times a day (TID) | ORAL | 0 refills | Status: DC

## 2023-05-12 MED ORDER — TRIAMCINOLONE ACETONIDE 0.1 % EX CREA: 1.0000 | TOPICAL_CREAM | Freq: Two times a day (BID) | CUTANEOUS | 1 refills | Status: AC

## 2023-05-12 NOTE — Progress Notes (Signed)
 Phone [phone]   Subjective:  Patient presents today for their annual physical. Chief complaint-noted.   See problem oriented charting- ROS- full  review of systems was completed and negative except for: allergies, some muscle mass loss from fewer sessions  The following were reviewed and entered/updated in epic: Past Medical History:  Diagnosis Date   Abnormal Pap smear of cervix    Blood transfusion    Blood transfusion without reported diagnosis    CELLULITIS, FOOT, LEFT 09/16/2008   Dermatophytosis of nail 02/01/2007        STD (sexually transmitted disease) 06/2009   trichamonus   Patient Active Problem List   Diagnosis Date Noted   Former smoker 03/03/2008    Priority: High   Attention deficit disorder (ADD) in adult 02/01/2007    Priority: Medium    Onychomycosis 03/12/2015    Priority: Low   Allergic rhinitis 03/03/2008    Priority: Low   Past Surgical History:  Procedure Laterality Date   COLPOSCOPY     FOOT SURGERY Right 2010   INTRAUTERINE DEVICE (IUD) INSERTION  03/2016   Mirena      Family History  Problem Relation Age of Onset   Osteoporosis Mother    Hyperlipidemia Father        fhx   Hypertension Father        fhx   Lung cancer Maternal Grandmother    Liver cancer Maternal Grandfather    Lung cancer Paternal Grandmother    Lung cancer Paternal Grandfather    Cancer Other        prostate/fhx   Breast cancer Neg Hx    Ovarian cancer Neg Hx     Medications- reviewed and updated Current Outpatient Medications  Medication Sig Dispense Refill   fexofenadine-pseudoephedrine  (ALLEGRA-D 24) 180-240 MG 24 hr tablet Take 1 tablet by mouth as needed.     levonorgestrel  (MIRENA ) 20 MCG/24HR IUD 1 each by Intrauterine route once.     amphetamine -dextroamphetamine  (ADDERALL) 20 MG tablet Take 1 tablet (20 mg total) by mouth 3 (three) times daily. May fill today 90 tablet 0   amphetamine -dextroamphetamine  (ADDERALL) 20 MG tablet Take 1 tablet (20 mg  total) by mouth 3 (three) times daily. May fill in 2 months 90 tablet 0   amphetamine -dextroamphetamine  (ADDERALL) 20 MG tablet Take 1 tablet (20 mg total) by mouth 3 (three) times daily. May fill in 1 month 90 tablet 0   triamcinolone  cream (KENALOG ) 0.1 % Apply 1 Application topically 2 (two) times daily. For 7-10 days maximum 80 g 1   No current facility-administered medications for this visit.    Allergies-reviewed and updated Allergies  Allergen Reactions   Sulfamethoxazole-Trimethoprim     REACTION: rash--fever    Social History   Social History Narrative   Divorced-currently dating.  Has 1 daughter from prior marriage   Objective  Objective:  BP 128/78 (BP Location: Left Arm, Patient Position: Sitting, Cuff Size: Normal)   Pulse 90   Temp 98.4 F (36.9 C) (Temporal)   Resp 18   Ht 5\' 3"  (1.6 m)   Wt 105 lb 6.4 oz (47.8 kg)   SpO2 99%   BMI 18.67 kg/m  Gen: NAD, resting comfortably HEENT: Mucous membranes are moist. Oropharynx normal Neck: no thyromegaly CV: RRR no murmurs rubs or gallops Lungs: CTAB no crackles, wheeze, rhonchi Abdomen: soft/nontender/nondistended/normal bowel sounds. No rebound or guarding.  Ext: no edema Skin: warm, dry Neuro: grossly normal, moves all extremities, PERRLA   Assessment and Plan  43 y.o. female presenting for annual physical.  Health Maintenance counseling: 1. Anticipatory guidance: Patient counseled regarding regular dental exams - q6 months, eye exams - no vision issues,  avoiding smoking and second hand smoke, limiting alcohol to 1 beverage per day- under this , no illicit drugs .   2. Risk factor reduction:  Advised patient of need for regular exercise and diet rich and fruits and vegetables to reduce risk of heart attack and stroke.  Exercise-still working with trainer Monday through Friday- lately been more 3-4 just as they were moving.  Diet/weight management-discussed mild weight gain would be reasonable- she feels has  lost some muscle mass with less training sessions .  Wt Readings from Last 3 Encounters:  05/12/23 105 lb 6.4 oz (47.8 kg)  11/08/22 108 lb 12.8 oz (49.4 kg)  05/06/22 110 lb 9.6 oz (50.2 kg)  3. Immunizations/screenings/ancillary studies-outside of COVID shot but otherwise up-to-date Immunization History  Administered Date(s) Administered   HPV 9-valent 02/23/2022, 05/12/2022, 09/15/2022   Influenza Whole 11/02/2009   Influenza, Seasonal, Injecte, Preservative Fre 11/08/2022   Influenza,inj,Quad PF,6+ Mos 10/08/2012, 09/18/2013, 09/10/2014, 10/09/2015, 10/25/2016, 11/21/2017, 11/23/2018, 10/16/2020, 11/03/2021   PFIZER(Purple Top)SARS-COV-2 Vaccination 03/28/2020   Td 02/01/2007   Tdap 06/10/2015, 05/14/2020  4. Cervical cancer screening- 02/23/2022 with 5-year repeat 5. Breast cancer screening-  breast exam with GYN and mammogram -encouraged her to schedule breast center as overdue 6. Colon cancer screening - no family history, start at age 37  7. Skin cancer screening-Dr. Del Favia only if needed-no recent concerns. advised regular sunscreen use.  8. Birth control/STD check- intrauterine device in place since 2018- thinks will have removed at appointment this year 9. Osteoporosis screening at 63-will plan on this when postmenopausal. Osteoporosis in 30s in mom 10. Smoking associated screening - former smoker-quit smoking in 2020-will get urinalysis at least yearly but done last visit   Status of chronic or acute concerns   # Labs at last visit other than mildly low sodium but was very well-hydrated so could have been dilutional-repeat today offered- prefers next visit   # ADD S:control: reasonable but imperfect control medication: Adderall 20 mg 3 times daily Controlled substance contract: 09/10/2014, updated 04/15/20 UDS: 11/2022 NCCSRS/PDMP reviewed: updated today- low risk Original diagnosis: 6 th grade-and had been treated by Dr. Larrie Po through 2015 Birth control: IUD   A/P:  reasonable control- 3 month refills and can call at that point for refill then 3 months longer   #skin irritation with fans/carpets at work- wool carpet allergy- will refill triamcinolone   Recommended follow up: Return in about 6 months (around 11/12/2023) for followup or sooner if needed.Schedule b4 you leave.  Lab/Order associations: labs in November- we will hold off   ICD-10-CM   1. Preventative health care  Z00.00      Meds ordered this encounter  Medications   amphetamine -dextroamphetamine  (ADDERALL) 20 MG tablet    Sig: Take 1 tablet (20 mg total) by mouth 3 (three) times daily. May fill today    Dispense:  90 tablet    Refill:  0   amphetamine -dextroamphetamine  (ADDERALL) 20 MG tablet    Sig: Take 1 tablet (20 mg total) by mouth 3 (three) times daily. May fill in 2 months    Dispense:  90 tablet    Refill:  0   amphetamine -dextroamphetamine  (ADDERALL) 20 MG tablet    Sig: Take 1 tablet (20 mg total) by mouth 3 (three) times daily. May fill in 1 month  Dispense:  90 tablet    Refill:  0   triamcinolone  cream (KENALOG ) 0.1 %    Sig: Apply 1 Application topically 2 (two) times daily. For 7-10 days maximum    Dispense:  80 g    Refill:  1   Return precautions advised.  Clarisa Crooked, MD

## 2023-05-12 NOTE — Patient Instructions (Addendum)
 Health Maintenance Due  Topic Date Due   MAMMOGRAM  03/31/2016  [facility] [place] Schedule an appointment by calling [phone].  BIG thing from today   Recommended follow up: Return in about 6 months (around 11/12/2023) for followup or sooner if needed.Schedule b4 you leave.

## 2023-06-05 DIAGNOSIS — L03012 Cellulitis of left finger: Secondary | ICD-10-CM | POA: Diagnosis not present

## 2023-06-05 DIAGNOSIS — W57XXXA Bitten or stung by nonvenomous insect and other nonvenomous arthropods, initial encounter: Secondary | ICD-10-CM | POA: Diagnosis not present

## 2023-06-05 DIAGNOSIS — S60465A Insect bite (nonvenomous) of left ring finger, initial encounter: Secondary | ICD-10-CM | POA: Diagnosis not present

## 2023-08-30 ENCOUNTER — Other Ambulatory Visit: Payer: Self-pay | Admitting: Family Medicine

## 2023-08-30 NOTE — Telephone Encounter (Unsigned)
 Copied from CRM #[id number]. Topic: Clinical - Medication Refill >> Aug 30, 2023  3:47 PM Aisha D wrote: Medication: amphetamine -dextroamphetamine  (ADDERALL) 20 MG tablet    Has the patient contacted their pharmacy? No (Agent: If no, request that the patient contact the pharmacy for the refill. If patient does not wish to contact the pharmacy document the reason why and proceed with request.) (Agent: If yes, when and what did the pharmacy advise?)  This is the patient's preferred pharmacy:   [facility] [address] [place] - [address] [place] [address] Phone: [phone] Fax: [phone]   Is this the correct pharmacy for this prescription? Yes If no, delete pharmacy and type the correct one.   Has the prescription been filled recently? No  Is the patient out of the medication? No  Has the patient been seen for an appointment in the last year OR does the patient have an upcoming appointment? Yes  Can we respond through MyChart? Yes  Agent: Please be advised that Rx refills may take up to 3 business days. We ask that you follow-up with your pharmacy.

## 2023-08-31 MED ORDER — AMPHETAMINE-DEXTROAMPHETAMINE 20 MG PO TABS: 20.0000 mg | ORAL_TABLET | Freq: Three times a day (TID) | ORAL | 0 refills | Status: DC

## 2023-08-31 NOTE — Telephone Encounter (Signed)
 05/12/2023 LOV  05/12/2023 fill date  900 refills

## 2023-10-02 ENCOUNTER — Other Ambulatory Visit: Payer: Self-pay | Admitting: Family Medicine

## 2023-10-02 NOTE — Telephone Encounter (Unsigned)
 Copied from CRM #[id number]. Topic: Clinical - Medication Refill >> Oct 02, 2023  1:01 PM Paige D wrote: Medication: amphetamine -dextroamphetamine  (ADDERALL) 20 MG tablet    Has the patient contacted their pharmacy? Yes (Agent: If no, request that the patient contact the pharmacy for the refill. If patient does not wish to contact the pharmacy document the reason why and proceed with request.) (Agent: If yes, when and what did the pharmacy advise?)  This is the patient's preferred pharmacy:  [id number] - [place], [place] - [address] [place] [address] Phone: [phone] Fax: [phone]   Is this the correct pharmacy for this prescription? Yes If no, delete pharmacy and type the correct one.   Has the prescription been filled recently? No  Is the patient out of the medication? Yes  Has the patient been seen for an appointment in the last year OR does the patient have an upcoming appointment? Yes  Can we respond through MyChart? Yes  Agent: Please be advised that Rx refills may take up to 3 business days. We ask that you follow-up with your pharmacy.

## 2023-10-03 ENCOUNTER — Ambulatory Visit: Payer: Self-pay

## 2023-10-03 NOTE — Telephone Encounter (Signed)
 FYI Only or Action Required?: Action required by provider: medication refill request.  Patient was last seen in primary care on 05/12/2023 by Katrinka Garnette KIDD, MD.  Called Nurse Triage reporting Medication Problem.    Triage Disposition: Call PCP When Office is Open  Patient/caregiver understands and will follow disposition?: Yes    Copied from CRM [id number]. Topic: Clinical - Medication Question >> Oct 03, 2023  4:48 PM Rea ORN wrote: Reason for CRM: Pt called to follow up on rx Adderall. She has been out since Sunday. Please call back to advise. Reason for Disposition  Caller requesting a CONTROLLED substance prescription refill (e.g., narcotics, ADHD medicines)  Answer Assessment - Initial Assessment Questions Patient called requesting information regarding a mediation refill. Patient states she is currently out of her amphetamine -dextroamphetamine  (ADDERALL) 20 MG tablet. Patient made aware refills can take up to 3 days. Pt verbalized understanding. Patient states she would like medication to go pharmacy below.    [facility] [id number] [place], [place] - [address] [place] [address] Phone: [phone] Fax: [phone]    1. DRUG NAME: What medicine do you need to have refilled?     amphetamine -dextroamphetamine  (ADDERALL) 20 MG tablet 2. REFILLS REMAINING: How many refills are remaining? Notes: The label on the medicine or pill bottle will show how many refills are remaining. If there are no refills remaining, then a renewal may be needed.     0 3. PRESCRIBER: Who prescribed it? Note: The prescribing doctor or group is responsible for refill approvals.SABRA     PCP 4. PHARMACY: Have you contacted your pharmacy (drugstore)? Note: Some pharmacies will contact the doctor (or NP/PA).      Yes  5. SYMPTOMS: Do you have any symptoms?     Patient states she has had headaches.  Protocols used: Medication Refill and Renewal Call-A-AH

## 2023-10-04 MED ORDER — AMPHETAMINE-DEXTROAMPHETAMINE 20 MG PO TABS: 20.0000 mg | ORAL_TABLET | Freq: Three times a day (TID) | ORAL | 0 refills | Status: AC

## 2023-11-02 ENCOUNTER — Other Ambulatory Visit: Payer: Self-pay | Admitting: Family Medicine

## 2023-11-02 NOTE — Telephone Encounter (Unsigned)
 Copied from CRM [id number]. Topic: Clinical - Medication Refill >> Nov 02, 2023  1:38 PM Ashley R wrote: Medication:  amphetamine -dextroamphetamine  (ADDERALL) 20 MG tablet   Has the patient contacted their pharmacy? Yes  This is the patient's preferred pharmacy:  [facility] [id number] [place], [place] - [address] [place] [address] Phone: [phone] Fax: [phone]   Is this the correct pharmacy for this prescription? Yes If no, delete pharmacy and type the correct one.   Has the prescription been filled recently? Yes  Is the patient out of the medication? Yes  Has the patient been seen for an appointment in the last year OR does the patient have an upcoming appointment? Yes  Can we respond through MyChart? Yes  Agent: Please be advised that Rx refills may take up to 3 business days. We ask that you follow-up with your pharmacy.

## 2023-11-03 ENCOUNTER — Telehealth: Payer: Self-pay | Admitting: Family Medicine

## 2023-11-03 NOTE — Telephone Encounter (Signed)
 Copied from CRM [id number]. Topic: Clinical - Medication Refill >> Nov 02, 2023  1:38 PM Ashley R wrote: Medication:  amphetamine -dextroamphetamine  (ADDERALL) 20 MG tablet   Has the patient contacted their pharmacy? Yes  This is the patient's preferred pharmacy:  [facility] [id number] [place], [place] - [address] [place] [address] Phone: [phone] Fax: [phone]   Is this the correct pharmacy for this prescription? Yes If no, delete pharmacy and type the correct one.   Has the prescription been filled recently? Yes  Is the patient out of the medication? Yes  Has the patient been seen for an appointment in the last year OR does the patient have an upcoming appointment? Yes  Can we respond through MyChart? Yes  Agent: Please be advised that Rx refills may take up to 3 business days. We ask that you follow-up with your pharmacy. >> Nov 03, 2023  3:13 PM Franky GRADE wrote: Patient is calling to follow up on the refill request, she is out of the medication and did not want to go into the weekend without it.

## 2023-11-04 MED ORDER — AMPHETAMINE-DEXTROAMPHETAMINE 20 MG PO TABS: 20.0000 mg | ORAL_TABLET | Freq: Three times a day (TID) | ORAL | 0 refills | Status: AC

## 2023-11-16 ENCOUNTER — Ambulatory Visit: Admitting: Family Medicine

## 2023-11-24 DIAGNOSIS — F900 Attention-deficit hyperactivity disorder, predominantly inattentive type: Secondary | ICD-10-CM | POA: Diagnosis not present

## 2023-11-24 DIAGNOSIS — Z23 Encounter for immunization: Secondary | ICD-10-CM | POA: Diagnosis not present

## 2023-11-27 ENCOUNTER — Ambulatory Visit: Admitting: Family Medicine

## 2023-11-28 ENCOUNTER — Encounter: Payer: Self-pay | Admitting: Family Medicine
